# Patient Record
Sex: Female | Born: 1945
Health system: Southern US, Community
[De-identification: ages and names within clinical notes are randomized; demographics above are authoritative.]

## PROBLEM LIST (undated history)

## (undated) DIAGNOSIS — G20A1 Parkinson's disease without dyskinesia, without mention of fluctuations: Secondary | ICD-10-CM

## (undated) DIAGNOSIS — F0390 Unspecified dementia without behavioral disturbance: Secondary | ICD-10-CM

## (undated) DIAGNOSIS — M1990 Unspecified osteoarthritis, unspecified site: Secondary | ICD-10-CM

## (undated) DIAGNOSIS — I10 Essential (primary) hypertension: Secondary | ICD-10-CM

## (undated) DIAGNOSIS — G20 Parkinson's disease: Secondary | ICD-10-CM

## (undated) HISTORY — DX: Parkinson's disease without dyskinesia, without mention of fluctuations: G20.A1

## (undated) HISTORY — PX: ABDOMINAL HYSTERECTOMY: SHX81

## (undated) HISTORY — DX: Parkinson's disease: G20

## (undated) HISTORY — DX: Unspecified osteoarthritis, unspecified site: M19.90

## (undated) HISTORY — PX: REPLACEMENT TOTAL KNEE: SUR1224

## (undated) HISTORY — DX: Essential (primary) hypertension: I10

---

## 2015-01-19 HISTORY — PX: HIP ARTHROPLASTY: SHX981

## 2015-02-12 ENCOUNTER — Encounter: Payer: Self-pay | Admitting: Internal Medicine

## 2015-02-12 ENCOUNTER — Non-Acute Institutional Stay (SKILLED_NURSING_FACILITY): Payer: Medicare Other | Admitting: Internal Medicine

## 2015-02-12 DIAGNOSIS — Z9660 Presence of unspecified orthopedic joint implant: Secondary | ICD-10-CM

## 2015-02-12 DIAGNOSIS — Z96649 Presence of unspecified artificial hip joint: Secondary | ICD-10-CM | POA: Insufficient documentation

## 2015-02-12 DIAGNOSIS — D62 Acute posthemorrhagic anemia: Secondary | ICD-10-CM | POA: Diagnosis not present

## 2015-02-12 DIAGNOSIS — G20 Parkinson's disease: Secondary | ICD-10-CM | POA: Diagnosis not present

## 2015-02-12 DIAGNOSIS — N183 Chronic kidney disease, stage 3 unspecified: Secondary | ICD-10-CM

## 2015-02-12 DIAGNOSIS — M1990 Unspecified osteoarthritis, unspecified site: Secondary | ICD-10-CM | POA: Insufficient documentation

## 2015-02-12 DIAGNOSIS — G20A1 Parkinson's disease without dyskinesia, without mention of fluctuations: Secondary | ICD-10-CM | POA: Insufficient documentation

## 2015-02-12 DIAGNOSIS — I10 Essential (primary) hypertension: Secondary | ICD-10-CM

## 2015-02-12 NOTE — Assessment & Plan Note (Signed)
D/c Hb 9.2, no reported tx needed

## 2015-02-12 NOTE — Progress Notes (Deleted)
MRN: [id number] Name: Rhonda Santana  Sex: female Age: 69 y.o. DOB: May 02, 1946  PSC #:  Facility/Room: Level Of Care: SNF Provider: Merrilee SeashoreALEXANDER, ANNE D Emergency Contacts: No emergency contact information on file.  Code Status:   Allergies: Review of patient's allergies indicates not on file.  No chief complaint on file.   HPI: Patient is 69 y.o. female who  No past medical history on file.  No past surgical history on file.    Medication List    Notice  As of 02/12/2015 11:40 AM   You have not been prescribed any medications.      No orders of the defined types were placed in this encounter.     There is no immunization history on file for this patient.  History  Substance Use Topics  . Smoking status: Not on file  . Smokeless tobacco: Not on file  . Alcohol Use: Not on file    There were no vitals filed for this visit.  Physical Exam  GENERAL APPEARANCE: Alert, conversant. No acute distress.  HEENT: Unremarkable. RESPIRATORY: Breathing is even, unlabored. Lung sounds are clear   CARDIOVASCULAR: Heart RRR no murmurs, rubs or gallops. No peripheral edema.  GASTROINTESTINAL: Abdomen is soft, non-tender, not distended w/ normal bowel sounds.  NEUROLOGIC: Cranial nerves 2-12 grossly intact. Moves all extremities  There are no active problems to display for this patient.   CBC No results found for: WBC, RBC, HGB, HCT, PLT, MCV, LYMPHSABS, MONOABS, EOSABS, BASOSABS  CMP  No results found for: NA, K, CL, CO2, GLUCOSE, BUN, CREATININE, CALCIUM, PROT, ALBUMIN, AST, ALT, ALKPHOS, BILITOT, GFRNONAA, GFRAA  Assessment and Plan  No problem-specific assessment & plan notes found for this encounter.   Margit HanksALEXANDER, ANNE D, MD   Pt seen 02/07/2015

## 2015-02-12 NOTE — Progress Notes (Signed)
MRN: [id number] Name: Rhonda Santana  Sex: female Age: 69 y.o. DOB: 1946/07/29  [facility] #: [facility] Facility/Room:107 Level Of Care: SNF Provider: Merrilee Seashore D Emergency Contacts: No emergency contact information on file.  Code Status: DNR  Allergies: Codeine; Lipitor; Penicillins; and Sulfa antibiotics  Chief Complaint  Patient presents with  . New Admit To SNF    HPI: Patient is 69 y.o. female who is admitted to SNF s/p L hip arthroplasty for OT/PT.  Past Medical History  Diagnosis Date  . Hypertension   . Arthritis   . Parkinson's disease     Past Surgical History  Procedure Laterality Date  . Hip arthroplasty Left 01/2015  . Abdominal hysterectomy        Medication List       This list is accurate as of: 02/12/15 12:23 PM.  Always use your most recent med list.               alendronate 70 MG tablet  Commonly known as:  FOSAMAX  Take 70 mg by mouth once a week. Take with a full glass of water on an empty stomach.     betamethasone dipropionate 0.05 % cream  Commonly known as:  DIPROLENE  Apply topically 2 (two) times daily as needed.     Calcium Carb-Cholecalciferol 500-400 MG-UNIT Chew  Chew by mouth.     Calcium Carb-Cholecalciferol 600-200 MG-UNIT Tabs  Take 2 tablets by mouth daily.     ferrous sulfate 325 (65 FE) MG tablet  Take 325 mg by mouth daily with breakfast.     folic acid 1 MG tablet  Commonly known as:  FOLVITE  Take 1 mg by mouth daily.     gabapentin 600 MG tablet  Commonly known as:  NEURONTIN  Take 1,800 mg by mouth daily.     hydrochlorothiazide 25 MG tablet  Commonly known as:  HYDRODIURIL  Take 25 mg by mouth daily.     HYDROcodone-acetaminophen 10-325 MG per tablet  Commonly known as:  NORCO  Take 1 tablet by mouth every 4 (four) hours as needed.     lisinopril 20 MG tablet  Commonly known as:  PRINIVIL,ZESTRIL  Take 20 mg by mouth daily.     methotrexate 15 MG tablet  Commonly known as:  RHEUMATREX  Take 15 mg  by mouth once a week. Caution: Chemotherapy. Protect from light.     metroNIDAZOLE 0.75 % cream  Commonly known as:  METROCREAM  Apply topically daily.     predniSONE 5 MG tablet  Commonly known as:  DELTASONE  Take 5 mg by mouth daily with breakfast.     rotigotine 4 MG/24HR  Commonly known as:  NEUPRO  Place 1 patch onto the skin daily.     sertraline 100 MG tablet  Commonly known as:  ZOLOFT  Take 100 mg by mouth daily.     traMADol 50 MG tablet  Commonly known as:  ULTRAM  Take 50 mg by mouth 4 (four) times daily.        Meds ordered this encounter  Medications  . alendronate (FOSAMAX) 70 MG tablet    Sig: Take 70 mg by mouth once a week. Take with a full glass of water on an empty stomach.  . betamethasone dipropionate (DIPROLENE) 0.05 % cream    Sig: Apply topically 2 (two) times daily as needed.  . Calcium Carb-Cholecalciferol 500-400 MG-UNIT CHEW    Sig: Chew by mouth.  . Calcium Carb-Cholecalciferol 600-200 MG-UNIT  TABS    Sig: Take 2 tablets by mouth daily.  . ferrous sulfate 325 (65 FE) MG tablet    Sig: Take 325 mg by mouth daily with breakfast.  . folic acid (FOLVITE) 1 MG tablet    Sig: Take 1 mg by mouth daily.  Marland Kitchen gabapentin (NEURONTIN) 600 MG tablet    Sig: Take 1,800 mg by mouth daily.  . hydrochlorothiazide (HYDRODIURIL) 25 MG tablet    Sig: Take 25 mg by mouth daily.  Marland Kitchen lisinopril (PRINIVIL,ZESTRIL) 20 MG tablet    Sig: Take 20 mg by mouth daily.  . methotrexate (RHEUMATREX) 15 MG tablet    Sig: Take 15 mg by mouth once a week. Caution: Chemotherapy. Protect from light.  . metroNIDAZOLE (METROCREAM) 0.75 % cream    Sig: Apply topically daily.  . predniSONE (DELTASONE) 5 MG tablet    Sig: Take 5 mg by mouth daily with breakfast.  . rotigotine (NEUPRO) 4 MG/24HR    Sig: Place 1 patch onto the skin daily.  . sertraline (ZOLOFT) 100 MG tablet    Sig: Take 100 mg by mouth daily.  . traMADol (ULTRAM) 50 MG tablet    Sig: Take 50 mg by mouth 4  (four) times daily.  Marland Kitchen HYDROcodone-acetaminophen (NORCO) 10-325 MG per tablet    Sig: Take 1 tablet by mouth every 4 (four) hours as needed.     There is no immunization history on file for this patient.  History  Substance Use Topics  . Smoking status: Never Smoker   . Smokeless tobacco: Not on file  . Alcohol Use: No    Family history is noncontributory    Review of Systems  DATA OBTAINED: from patient, nurse GENERAL:  no fevers, fatigue, appetite changes SKIN: No itching, rash or wounds EYES: No eye pain, redness, discharge EARS: No earache, tinnitus, change in hearing NOSE: No congestion, drainage or bleeding  MOUTH/THROAT: No mouth or tooth pain, No sore throat RESPIRATORY: No cough, wheezing, SOB CARDIAC: No chest pain, palpitations, lower extremity edema  GI: No abdominal pain, No N/V/D or constipation, No heartburn or reflux  GU: No dysuria, frequency or urgency, or incontinence  MUSCULOSKELETAL: No unrelieved bone/joint pain NEUROLOGIC: No headache, dizziness  PSYCHIATRIC: No overt anxiety or sadness, No behavior issue.   Filed Vitals:   02/12/15 1142  BP: 107/62  Pulse: 73  Temp: 98.9 F (37.2 C)  Resp: 18    Physical Exam  GENERAL APPEARANCE: Alert, conversant,  No acute distress.  SKIN: No diaphoresis rash HEAD: Normocephalic, atraumatic  EYES: Conjunctiva/lids clear. Pupils round, reactive. EOMs intact.  EARS: External exam WNL, canals clear. Hearing grossly normal.  NOSE: No deformity or discharge.  MOUTH/THROAT: Lips w/o lesions  RESPIRATORY: Breathing is even, unlabored. Lung sounds are clear   CARDIOVASCULAR: Heart RRR no murmurs, rubs or gallops. No peripheral edema.   GASTROINTESTINAL: Abdomen is soft, non-tender, not distended w/ normal bowel sounds. GENITOURINARY: Bladder non tender, not distended  MUSCULOSKELETAL: No abnormal joints or musculature NEUROLOGIC:  Cranial nerves 2-12 grossly intac PSYCHIATRIC: Mood and affect appropriate to  situation, no behavioral issues  Patient Active Problem List   Diagnosis Date Noted  . S/P total hip arthroplasty 02/12/2015  . Acute blood loss anemia 02/12/2015  . CKD (chronic kidney disease) stage 3, GFR 30-59 ml/min 02/12/2015  . Hypertension   . Arthritis   . Parkinson's disease         Assessment and Plan  S/P total hip arthroplasty For end stage  OA; norco for pain, ASA for DVT prophylaxis , SNF for OT/PT.   Acute blood loss anemia D/c Hb 9.2, no reported tx needed   CKD (chronic kidney disease) stage 3, GFR 30-59 ml/min GFR 757, BUN/Cr -17/0.98; plan monitor fluid intake   Hypertension Reported uncontrolled just after surg but controlled now on Lisinopril 20 mg and HCTZ 25 mg   Parkinson's disease Contn ue home med, neurontin and neupro    Pt seen 02/07/2015 Rhonda Santana, Talajah Slimp D, MD

## 2015-02-12 NOTE — Assessment & Plan Note (Signed)
For end stage OA; norco for pain, ASA for DVT prophylaxis , SNF for OT/PT.

## 2015-02-12 NOTE — Assessment & Plan Note (Addendum)
GFR 57, BUN/Cr -17/0.98; plan monitor fluid intake

## 2015-02-12 NOTE — Assessment & Plan Note (Signed)
Contn ue home med, neurontin and neupro

## 2015-02-12 NOTE — Assessment & Plan Note (Signed)
Reported uncontrolled just after surg but controlled now on Lisinopril 20 mg and HCTZ 25 mg

## 2015-02-14 ENCOUNTER — Encounter: Payer: Self-pay | Admitting: Internal Medicine

## 2015-02-14 ENCOUNTER — Non-Acute Institutional Stay (SKILLED_NURSING_FACILITY): Payer: Medicare Other | Admitting: Internal Medicine

## 2015-02-14 DIAGNOSIS — D62 Acute posthemorrhagic anemia: Secondary | ICD-10-CM | POA: Diagnosis not present

## 2015-02-14 DIAGNOSIS — I10 Essential (primary) hypertension: Secondary | ICD-10-CM

## 2015-02-14 DIAGNOSIS — G20 Parkinson's disease: Secondary | ICD-10-CM

## 2015-02-14 DIAGNOSIS — Z9660 Presence of unspecified orthopedic joint implant: Secondary | ICD-10-CM

## 2015-02-14 DIAGNOSIS — N183 Chronic kidney disease, stage 3 unspecified: Secondary | ICD-10-CM

## 2015-02-14 DIAGNOSIS — Z96649 Presence of unspecified artificial hip joint: Secondary | ICD-10-CM

## 2015-02-14 NOTE — Progress Notes (Signed)
MRN: [id number] Name: Rhonda Santana  Sex: female Age: 69 y.o. DOB: 1946/06/21  [facility] #: [facility] Facility/Room:106 Level Of Care: SNF Provider: Merrilee Seashore D Emergency Contacts: No emergency contact information on file.  Code Status: DNR  Allergies: Codeine; Lipitor; Penicillins; and Sulfa antibiotics  Chief Complaint  Patient presents with  . Discharge Note    HPI: Patient is 69 y.o. female who was admitted to SNF s/p hip arthroplasty who is now ready to be d/c to home.  Past Medical History  Diagnosis Date  . Hypertension   . Arthritis   . Parkinson's disease     Past Surgical History  Procedure Laterality Date  . Hip arthroplasty Left 01/2015  . Abdominal hysterectomy        Medication List       This list is accurate as of: 02/14/15  2:16 PM.  Always use your most recent med list.               alendronate 70 MG tablet  Commonly known as:  FOSAMAX  Take 70 mg by mouth once a week. Take with a full glass of water on an empty stomach.     betamethasone dipropionate 0.05 % cream  Commonly known as:  DIPROLENE  Apply topically 2 (two) times daily as needed.     Calcium Carb-Cholecalciferol 600-200 MG-UNIT Tabs  Take 2 tablets by mouth daily.     ferrous sulfate 325 (65 FE) MG tablet  Take 325 mg by mouth daily with breakfast.     folic acid 1 MG tablet  Commonly known as:  FOLVITE  Take 1 mg by mouth daily.     gabapentin 600 MG tablet  Commonly known as:  NEURONTIN  Take 600 mg by mouth 2 (two) times daily. 1 po q am and 2 po qhs     hydrochlorothiazide 25 MG tablet  Commonly known as:  HYDRODIURIL  Take 25 mg by mouth daily.     lisinopril 20 MG tablet  Commonly known as:  PRINIVIL,ZESTRIL  Take 20 mg by mouth daily.     methotrexate 15 MG tablet  Commonly known as:  RHEUMATREX  Take 15 mg by mouth once a week. Caution: Chemotherapy. Protect from light.     metroNIDAZOLE 0.75 % cream  Commonly known as:  METROCREAM  Apply topically  daily.     predniSONE 5 MG tablet  Commonly known as:  DELTASONE  Take 5 mg by mouth daily with breakfast.     rotigotine 4 MG/24HR  Commonly known as:  NEUPRO  Place 1 patch onto the skin daily.     sertraline 100 MG tablet  Commonly known as:  ZOLOFT  Take 100 mg by mouth daily.     traMADol 50 MG tablet  Commonly known as:  ULTRAM  Take 50 mg by mouth 4 (four) times daily. #20, NR        No orders of the defined types were placed in this encounter.     There is no immunization history on file for this patient.  History  Substance Use Topics  . Smoking status: Never Smoker   . Smokeless tobacco: Not on file  . Alcohol Use: No    Filed Vitals:   02/14/15 1350  BP: 105/58  Pulse: 74  Temp: 97.6 F (36.4 C)  Resp: 18    Physical Exam  GENERAL APPEARANCE: Alert, conversant. No acute distress.  HEENT: Unremarkable. RESPIRATORY: Breathing is even, unlabored. Lung sounds are  clear   CARDIOVASCULAR: Heart RRR no murmurs, rubs or gallops. No peripheral edema.  GASTROINTESTINAL: Abdomen is soft, non-tender, not distended w/ normal bowel sounds.  NEUROLOGIC: Cranial nerves 2-12 grossly intact. Moves all extremities  Patient Active Problem List   Diagnosis Date Noted  . S/P total hip arthroplasty 02/12/2015  . Acute blood loss anemia 02/12/2015  . CKD (chronic kidney disease) stage 3, GFR 30-59 ml/min 02/12/2015  . Hypertension   . Arthritis   . Parkinson's disease          Assessment and Plan  Pt had an uneventful rehab course and is d/c to home in stable condition with HH/OT/PT.  Margit HanksALEXANDER, Mcihael Hinderman D, MD

## 2017-07-26 ENCOUNTER — Emergency Department (HOSPITAL_BASED_OUTPATIENT_CLINIC_OR_DEPARTMENT_OTHER)
Admission: EM | Admit: 2017-07-26 | Discharge: 2017-07-26 | Disposition: A | Discharge status: Home / Self Care | Payer: Medicare Other | Attending: Emergency Medicine | Admitting: Emergency Medicine

## 2017-07-26 ENCOUNTER — Emergency Department (HOSPITAL_BASED_OUTPATIENT_CLINIC_OR_DEPARTMENT_OTHER): Payer: Medicare Other

## 2017-07-26 ENCOUNTER — Encounter (HOSPITAL_BASED_OUTPATIENT_CLINIC_OR_DEPARTMENT_OTHER): Payer: Self-pay

## 2017-07-26 DIAGNOSIS — N183 Chronic kidney disease, stage 3 (moderate): Secondary | ICD-10-CM | POA: Insufficient documentation

## 2017-07-26 DIAGNOSIS — Y92019 Unspecified place in single-family (private) house as the place of occurrence of the external cause: Secondary | ICD-10-CM | POA: Insufficient documentation

## 2017-07-26 DIAGNOSIS — Z79899 Other long term (current) drug therapy: Secondary | ICD-10-CM | POA: Insufficient documentation

## 2017-07-26 DIAGNOSIS — S0101XA Laceration without foreign body of scalp, initial encounter: Secondary | ICD-10-CM | POA: Diagnosis not present

## 2017-07-26 DIAGNOSIS — Z7982 Long term (current) use of aspirin: Secondary | ICD-10-CM | POA: Diagnosis not present

## 2017-07-26 DIAGNOSIS — Z96651 Presence of right artificial knee joint: Secondary | ICD-10-CM | POA: Diagnosis not present

## 2017-07-26 DIAGNOSIS — S0990XA Unspecified injury of head, initial encounter: Secondary | ICD-10-CM | POA: Diagnosis present

## 2017-07-26 DIAGNOSIS — Y998 Other external cause status: Secondary | ICD-10-CM | POA: Insufficient documentation

## 2017-07-26 DIAGNOSIS — R51 Headache: Secondary | ICD-10-CM | POA: Diagnosis not present

## 2017-07-26 DIAGNOSIS — Z96642 Presence of left artificial hip joint: Secondary | ICD-10-CM | POA: Diagnosis not present

## 2017-07-26 DIAGNOSIS — G20 Parkinson's disease: Secondary | ICD-10-CM | POA: Insufficient documentation

## 2017-07-26 DIAGNOSIS — M546 Pain in thoracic spine: Secondary | ICD-10-CM | POA: Insufficient documentation

## 2017-07-26 DIAGNOSIS — I129 Hypertensive chronic kidney disease with stage 1 through stage 4 chronic kidney disease, or unspecified chronic kidney disease: Secondary | ICD-10-CM | POA: Insufficient documentation

## 2017-07-26 DIAGNOSIS — Y939 Activity, unspecified: Secondary | ICD-10-CM | POA: Diagnosis not present

## 2017-07-26 DIAGNOSIS — M542 Cervicalgia: Secondary | ICD-10-CM | POA: Diagnosis not present

## 2017-07-26 DIAGNOSIS — S0003XA Contusion of scalp, initial encounter: Secondary | ICD-10-CM

## 2017-07-26 DIAGNOSIS — Z96652 Presence of left artificial knee joint: Secondary | ICD-10-CM | POA: Diagnosis not present

## 2017-07-26 DIAGNOSIS — W19XXXA Unspecified fall, initial encounter: Secondary | ICD-10-CM | POA: Diagnosis not present

## 2017-07-26 MED ORDER — BACITRACIN-NEOMYCIN-POLYMYXIN 400-5-5000 EX OINT: 1.0000 "application " | TOPICAL_OINTMENT | Freq: Two times a day (BID) | CUTANEOUS | 0 refills | Status: AC

## 2017-07-26 NOTE — ED Notes (Signed)
ED Provider at bedside. 

## 2017-07-26 NOTE — ED Triage Notes (Signed)
Pt presents after a fall tonight. Pt has lac to back of her head, bleeding controlled. Pt c/o pain to her upper back. Pt denies LOC. Pt reports hx of parkinson's with falls often

## 2017-07-26 NOTE — ED Provider Notes (Signed)
[facility] EMERGENCY DEPARTMENT Provider Note   CSN: [id number] Arrival date & time: 07/26/17  0118     History   Chief Complaint Chief Complaint  Patient presents with  . Fall    HPI Rhonda Santana is a 71 y.o. female.  HPI Patient comes in with chief complaint of fall. Patient has history of Parkinson's disease and high blood pressure.  Patient is not on any anticoagulants.  Patient reports that she had a fall which was mechanical in nature earlier at her home.  Patient started having bleeding from her scalp and decided to come to the ER.  Patient is having some neck pain and mid thoracic pain as well.  Patient denies any numbness, tingling, seizures, loss of consciousness.  Patient does have a headache as well.  Past Medical History:  Diagnosis Date  . Arthritis   . Hypertension   . Parkinson's disease [facility])     Patient Active Problem List   Diagnosis Date Noted  . S/P total hip arthroplasty 02/12/2015  . Acute blood loss anemia 02/12/2015  . CKD (chronic kidney disease) stage 3, GFR 30-59 ml/min (HCC) 02/12/2015  . Hypertension   . Arthritis   . Parkinson's disease [facility])     Past Surgical History:  Procedure Laterality Date  . ABDOMINAL HYSTERECTOMY    . HIP ARTHROPLASTY Left 01/2015  . REPLACEMENT TOTAL KNEE Bilateral     OB History    No data available       Home Medications    Prior to Admission medications   Medication Sig Start Date End Date Taking? Authorizing Provider  aspirin 81 MG tablet Take 81 mg by mouth daily.   Yes [provider]  Calcium Carb-Cholecalciferol 600-200 MG-UNIT TABS Take 2 tablets by mouth daily.   Yes [provider]  ferrous sulfate 325 (65 FE) MG tablet Take 325 mg by mouth daily with breakfast.   Yes [provider]  gabapentin (NEURONTIN) 600 MG tablet Take 600 mg by mouth 2 (two) times daily. 1 po q am and 2 po qhs   Yes [provider]  methotrexate (RHEUMATREX) 15 MG tablet  Take 15 mg by mouth once a week. Caution: Chemotherapy. Protect from light.   Yes [provider]  metroNIDAZOLE (METROCREAM) 0.75 % cream Apply topically daily.   Yes [provider]  predniSONE (DELTASONE) 5 MG tablet Take 5 mg by mouth daily with breakfast.   Yes [provider]  rotigotine (NEUPRO) 4 MG/24HR Place 1 patch onto the skin daily.   Yes [provider]  alendronate (FOSAMAX) 70 MG tablet Take 70 mg by mouth once a week. Take with a full glass of water on an empty stomach.    [provider]  betamethasone dipropionate (DIPROLENE) 0.05 % cream Apply topically 2 (two) times daily as needed.    [provider]  folic acid (FOLVITE) 1 MG tablet Take 1 mg by mouth daily.    [provider]  hydrochlorothiazide (HYDRODIURIL) 25 MG tablet Take 25 mg by mouth daily.    [provider]  lisinopril (PRINIVIL,ZESTRIL) 20 MG tablet Take 20 mg by mouth daily.    [provider]  neomycin-bacitracin-polymyxin (NEOSPORIN) ointment Apply 1 application every 12 (twelve) hours topically. apply to eye 07/26/17   Derwood Kaplan, MD  sertraline (ZOLOFT) 100 MG tablet Take 100 mg by mouth daily.    [provider]  traMADol (ULTRAM) 50 MG tablet Take 50 mg by mouth 4 (  four) times daily. #20, NR    [provider]    Family History Family History  Problem Relation Age of Onset  . Thrombosis Mother   . Cancer Father        breast  . Heart disease Neg Hx   . Diabetes Neg Hx     Social History Social History   Tobacco Use  . Smoking status: Never Smoker  . Smokeless tobacco: Never Used  Substance Use Topics  . Alcohol use: No  . Drug use: No     Allergies   Codeine; Lipitor [atorvastatin]; Penicillins; and Sulfa antibiotics   Review of Systems Review of Systems  Constitutional: Negative for activity change.  Musculoskeletal: Positive for back pain.  Skin: Positive for wound.    Neurological: Positive for headaches.  Hematological: Does not bruise/bleed easily.     Physical Exam Updated Vital Signs BP 138/86 (BP Location: Left Arm)   Pulse 87   Temp 97.7 F (36.5 C) (Oral)   Resp 18   Ht 5\' 1"  (1.549 m)   Wt 72.6 kg (160 lb)   SpO2 97%   BMI 30.23 kg/m   Physical Exam  Constitutional: She is oriented to person, place, and time. She appears well-developed.  HENT:  Head: Normocephalic and atraumatic.  Eyes: EOM are normal.  Neck: Normal range of motion. Neck supple.  Cardiovascular: Normal rate.  Pulmonary/Chest: Effort normal.  Abdominal: Bowel sounds are normal.  Musculoskeletal:  Patient has midline C-spine and T-spine tenderness. Otherwise:  Head to toe evaluation shows no hematoma, bleeding of the scalp, no facial abrasions, no spine step offs, crepitus of the chest or neck, no tenderness to palpation of the bilateral upper and lower extremities, no gross deformities, no chest tenderness, no pelvic pain.   Neurological: She is alert and oriented to person, place, and time.  Skin: Skin is warm and dry.  2 centimeters laceration to the scalp, actively bleeding  Nursing note and vitals reviewed.    ED Treatments / Results  Labs (all labs ordered are listed, but only abnormal results are displayed) Labs Reviewed - No data to display  EKG  EKG Interpretation None       Radiology Dg Thoracic Spine 2 View  Result Date: 07/26/2017 CLINICAL DATA:  Fall with pain EXAM: THORACIC SPINE 2 VIEWS COMPARISON:  None. FINDINGS: Thoracic alignment within normal limits. Vertebral body heights appear grossly maintained. There are mild degenerative changes. IMPRESSION: No acute osseous abnormality Electronically Signed   By: Jasmine PangKim  Fujinaga M.D.   On: 07/26/2017 02:41   Ct Head Wo Contrast  Result Date: 07/26/2017 CLINICAL DATA:  Slip and fall injury. Head trauma. Left posterior scalp laceration. History of Parkinson disease with multiple falls. EXAM:  CT HEAD WITHOUT CONTRAST CT CERVICAL SPINE WITHOUT CONTRAST TECHNIQUE: Multidetector CT imaging of the head and cervical spine was performed following the standard protocol without intravenous contrast. Multiplanar CT image reconstructions of the cervical spine were also generated. COMPARISON:  None. FINDINGS: CT HEAD FINDINGS Brain: Mild diffuse cerebral atrophy. Mild ventricular dilatation consistent with central atrophy. Patchy low-attenuation changes in the deep white matter consistent with small vessel ischemia. No mass-effect or midline shift. No abnormal extra-axial fluid collections. Gray-white matter junctions are distinct. Basal cisterns are not effaced. No acute intracranial hemorrhage. Vascular: No hyperdense vessel or unexpected calcification. Skull: Normal. Negative for fracture or focal lesion. Sinuses/Orbits: No acute finding. Other: None. CT CERVICAL SPINE FINDINGS Alignment: Reversal of the usual cervical lordosis. This may be  due to degenerative change or patient positioning but ligamentous injury or muscle spasm are not excluded. No anterior subluxation. Normal alignment of the facet joints. C1-2 articulation demonstrates normal alignment. Skull base and vertebrae: No acute fracture. No primary bone lesion or focal pathologic process. Soft tissues and spinal canal: No prevertebral fluid or swelling. No visible canal hematoma. Disc levels: Diffuse degenerative change throughout the cervical spine with narrowed interspaces and endplate hypertrophic changes. Changes are most prominent at C4-5, C5-6, and C6-7 levels. Severe degenerative changes at C1 to. Degenerative changes throughout the cervical facet joints. Upper chest: Suggestion of IA nodule in the right lung apex, incompletely visualized. Measurement is about 2 mm. Diffuse thyroid gland enlargement, likely goiter. Other: None. IMPRESSION: 1. No acute intracranial abnormalities. Mild chronic atrophy and small vessel ischemic changes. 2.  Nonspecific reversal of the usual cervical lordosis. Severe degenerative changes in the cervical spine. No acute displaced fractures identified. Electronically Signed   By: Burman Nieves M.D.   On: 07/26/2017 03:09   Ct Cervical Spine Wo Contrast  Result Date: 07/26/2017 CLINICAL DATA:  Slip and fall injury. Head trauma. Left posterior scalp laceration. History of Parkinson disease with multiple falls. EXAM: CT HEAD WITHOUT CONTRAST CT CERVICAL SPINE WITHOUT CONTRAST TECHNIQUE: Multidetector CT imaging of the head and cervical spine was performed following the standard protocol without intravenous contrast. Multiplanar CT image reconstructions of the cervical spine were also generated. COMPARISON:  None. FINDINGS: CT HEAD FINDINGS Brain: Mild diffuse cerebral atrophy. Mild ventricular dilatation consistent with central atrophy. Patchy low-attenuation changes in the deep white matter consistent with small vessel ischemia. No mass-effect or midline shift. No abnormal extra-axial fluid collections. Gray-white matter junctions are distinct. Basal cisterns are not effaced. No acute intracranial hemorrhage. Vascular: No hyperdense vessel or unexpected calcification. Skull: Normal. Negative for fracture or focal lesion. Sinuses/Orbits: No acute finding. Other: None. CT CERVICAL SPINE FINDINGS Alignment: Reversal of the usual cervical lordosis. This may be due to degenerative change or patient positioning but ligamentous injury or muscle spasm are not excluded. No anterior subluxation. Normal alignment of the facet joints. C1-2 articulation demonstrates normal alignment. Skull base and vertebrae: No acute fracture. No primary bone lesion or focal pathologic process. Soft tissues and spinal canal: No prevertebral fluid or swelling. No visible canal hematoma. Disc levels: Diffuse degenerative change throughout the cervical spine with narrowed interspaces and endplate hypertrophic changes. Changes are most prominent at  C4-5, C5-6, and C6-7 levels. Severe degenerative changes at C1 to. Degenerative changes throughout the cervical facet joints. Upper chest: Suggestion of IA nodule in the right lung apex, incompletely visualized. Measurement is about 2 mm. Diffuse thyroid gland enlargement, likely goiter. Other: None. IMPRESSION: 1. No acute intracranial abnormalities. Mild chronic atrophy and small vessel ischemic changes. 2. Nonspecific reversal of the usual cervical lordosis. Severe degenerative changes in the cervical spine. No acute displaced fractures identified. Electronically Signed   By: Burman Nieves M.D.   On: 07/26/2017 03:09    Procedures .Marland KitchenLaceration Repair Date/Time: 07/26/2017 3:43 AM Performed by: Derwood Kaplan, MD Authorized by: Derwood Kaplan, MD   Consent:    Consent obtained:  Verbal   Consent given by:  Patient   Risks discussed:  Pain, poor cosmetic result and infection   Alternatives discussed:  No treatment Anesthesia (see MAR for exact dosages):    Anesthesia method:  None Laceration details:    Location:  Scalp   Scalp location:  Crown   Length (cm):  2   Depth (mm):  2 Repair type:    Repair type:  Simple Pre-procedure details:    Preparation:  Patient was prepped and draped in usual sterile fashion and imaging obtained to evaluate for foreign bodies Exploration:    Hemostasis achieved with:  Direct pressure   Wound extent: no fascia violation noted, no foreign bodies/material noted, no nerve damage noted, no underlying fracture noted and no vascular damage noted     Contaminated: no   Treatment:    Area cleansed with:  Betadine   Amount of cleaning:  Standard   Irrigation solution:  Sterile saline   Visualized foreign bodies/material removed: no   Skin repair:    Repair method:  Staples   Number of staples:  2 Approximation:    Approximation:  Close   Vermilion border: well-aligned   Post-procedure details:    Dressing:  Open (no dressing)   Patient tolerance  of procedure:  Tolerated well, no immediate complications   (including critical care time)  Medications Ordered in ED Medications - No data to display   Initial Impression / Assessment and Plan / ED Course  I have reviewed the triage vital signs and the nursing notes.  Pertinent labs & imaging results that were available during my care of the patient were reviewed by me and considered in my medical decision making (see chart for details).     DDx includes: - Mechanical falls - ICH - Fractures - Contusions - Soft tissue injury  Patient comes in with chief complaint of fall.  Fall appears to be mechanical in nature.  Patient has a scalp laceration which was repaired via staples.  CT scan of the head, C-spine were obtained as we could not clinically clear the patient and they are negative.  Patient also has midline thoracic spine tenderness and the x-rays of the T-spine are negative. Will d/c. Wound care discussion completed.  Final Clinical Impressions(s) / ED Diagnoses   Final diagnoses:  Contusion of scalp, initial encounter  Acute midline thoracic back pain  Laceration of scalp, initial encounter    ED Discharge Orders        Ordered    neomycin-bacitracin-polymyxin (NEOSPORIN) ointment  Every 12 hours     07/26/17 0340       Derwood KaplanNanavati, Dyanara Cozza, MD 07/26/17 0345

## 2017-07-26 NOTE — Discharge Instructions (Signed)
We saw you in the ER after you had a fall. All the imaging results are normal, no fractures seen. No evidence of brain bleed.  Keep the laceration area clean and dry.  See your primary care doctor in 7-10 days for staple removal.  Please be very careful with walking, and do everything possible to prevent falls.

## 2017-07-26 NOTE — ED Notes (Signed)
Patient transported to X-ray & CT °

## 2018-11-03 IMAGING — CT CT CERVICAL SPINE W/O CM
4 of 7 series · 12 of 33 positions shown, 13 images · non-contrast
Comparison: None.

CLINICAL DATA: Slip and fall injury. Head trauma. Left posterior
scalp laceration. History of Parkinson disease with multiple falls.

EXAM:
CT HEAD WITHOUT CONTRAST
CT CERVICAL SPINE WITHOUT CONTRAST
TECHNIQUE: Multidetector CT imaging of the head and cervical spine was
performed following the standard protocol without intravenous
contrast. Multiplanar CT image reconstructions of the cervical spine
were also generated.

[Series 7: c_spine 2.0 i30s 3 · axial · 0.27mm/px · z∈[-262,-190]mm · 3 of 72 slices shown]
[im 18/72  bone]
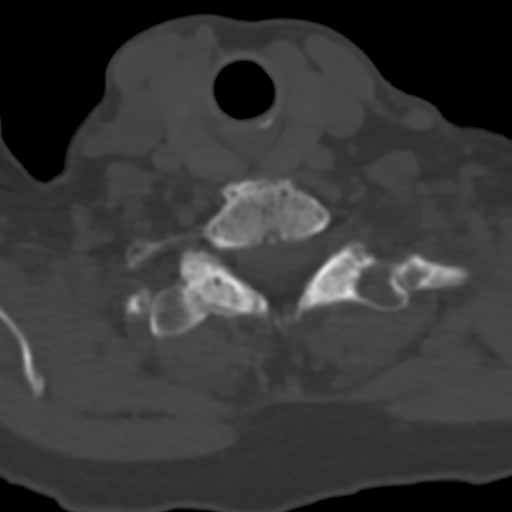
[im 36/72  bone]
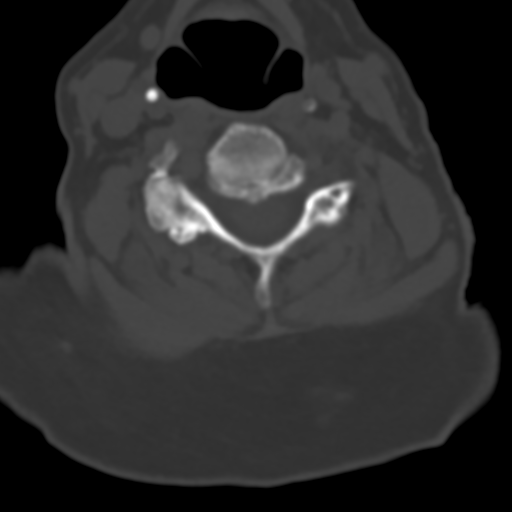
[im 54/72  bone]
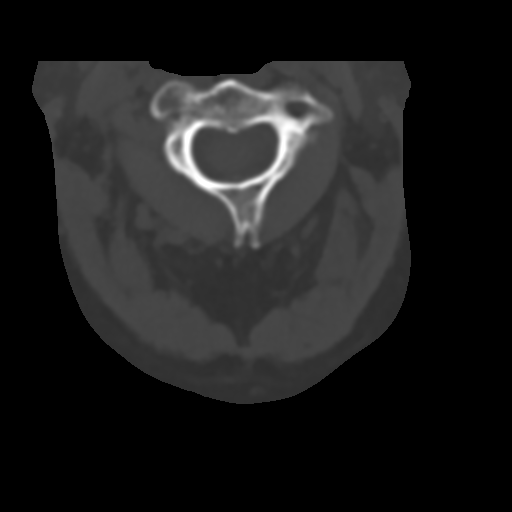

[Series 9: coronal c-sp · coronal · 0.24mm/px · 1 of 61 slices shown]
[im 31/61  bone]
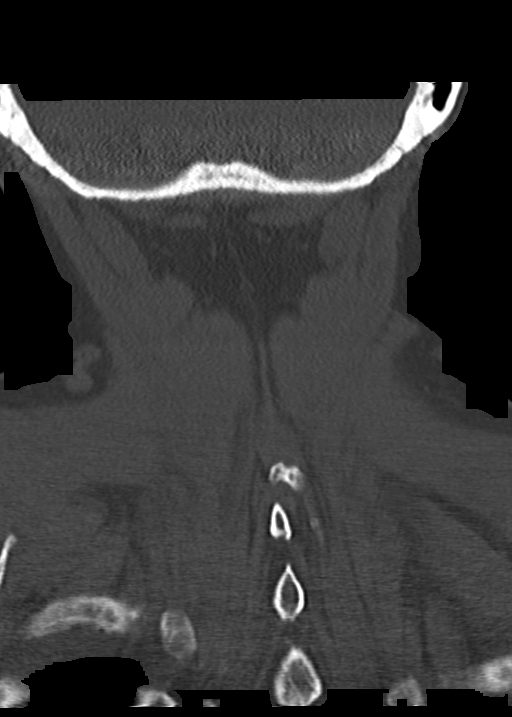

[Series 10: sagittals · sagittal · 0.26mm/px · 4 of 41 slices shown]
[im 9/41  bone]
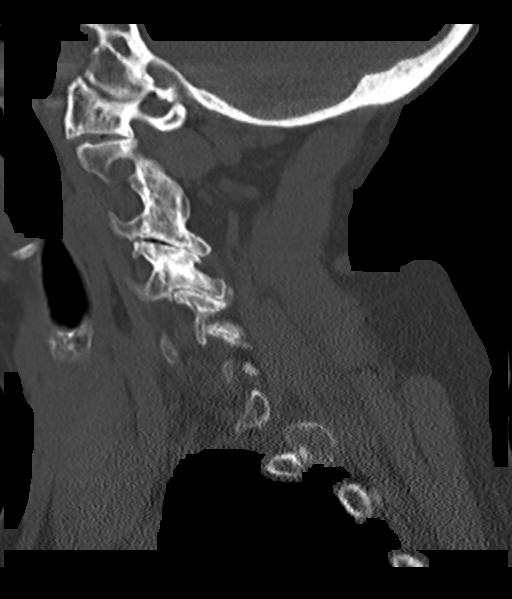
[im 17/41  bone]
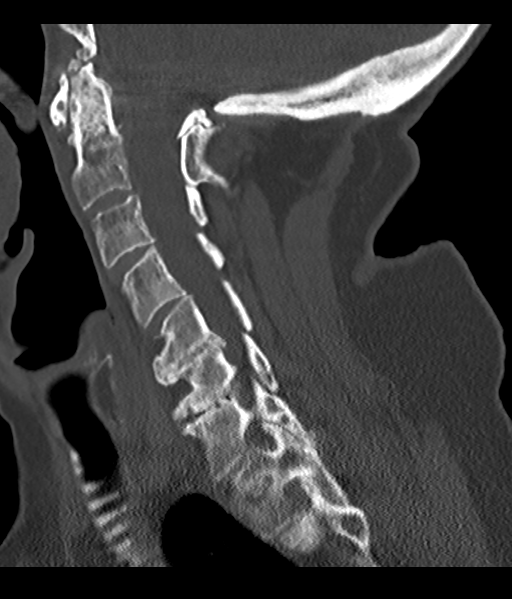
[im 25/41  bone]
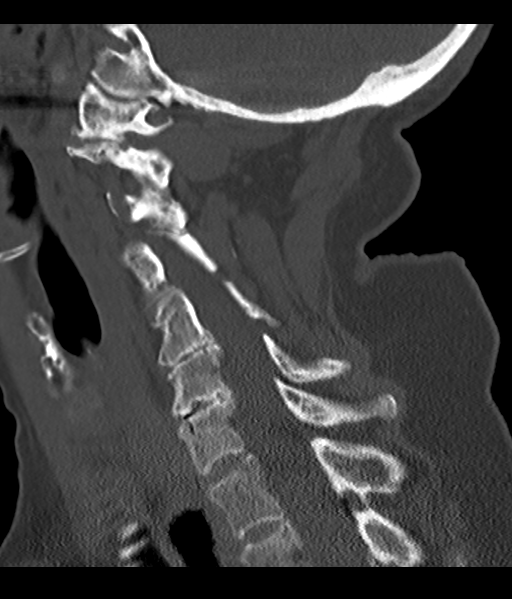
[im 33/41  bone]
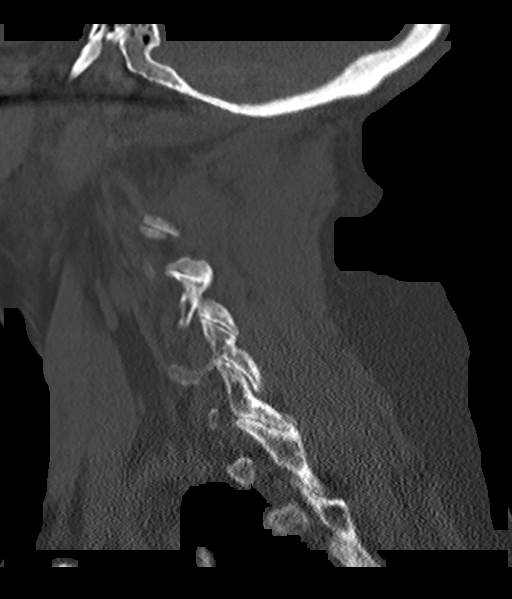

[Series 11: orthogonal c-sp · axial · 0.25mm/px · z∈[-291,-192]mm · 4 of 86 slices shown, 5 images]
[im 18/86  soft-tissue]
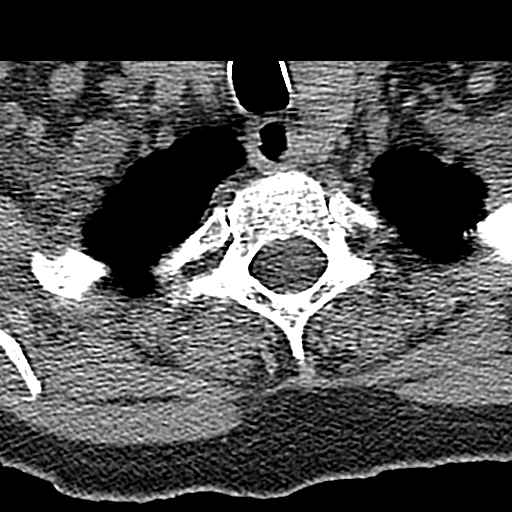
[im 18/86  bone]
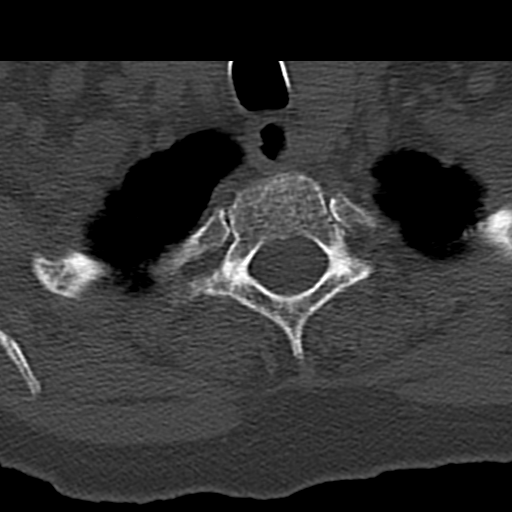
[im 35/86  bone]
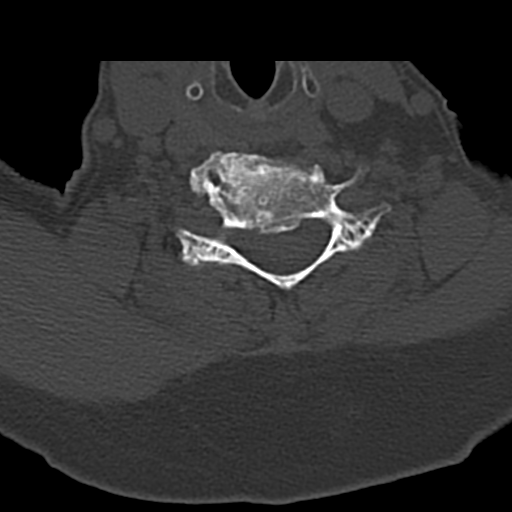
[im 52/86  bone]
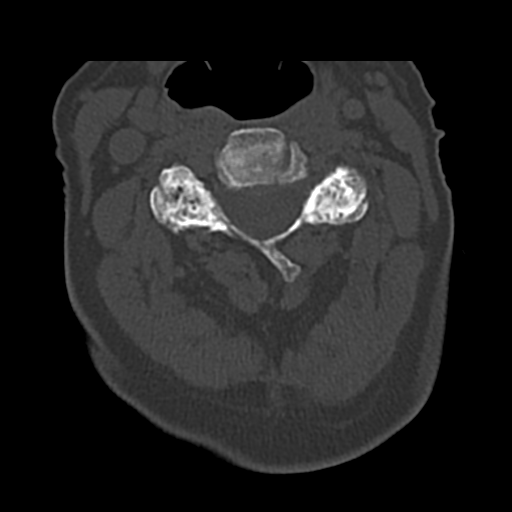
[im 69/86  bone]
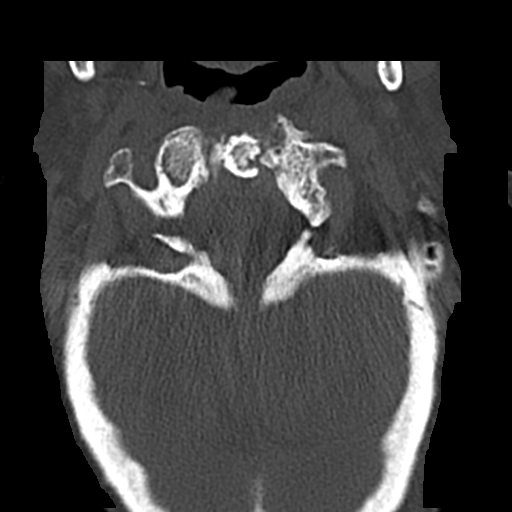

[12 of 33 positions shown; findings below may reference images not displayed]

FINDINGS: CT HEAD FINDINGS

Brain: Mild diffuse cerebral atrophy. Mild ventricular dilatation
consistent with central atrophy. Patchy low-attenuation changes in
the deep white matter consistent with small vessel ischemia. No
mass-effect or midline shift. No abnormal extra-axial fluid
collections. Gray-white matter junctions are distinct. Basal
cisterns are not effaced. No acute intracranial hemorrhage.

Vascular: No hyperdense vessel or unexpected calcification.

Skull: Normal. Negative for fracture or focal lesion.

Sinuses/Orbits: No acute finding.

Other: None.

CT CERVICAL SPINE FINDINGS

Alignment: Reversal of the usual cervical lordosis. This may be due
to degenerative change or patient positioning but ligamentous injury
or muscle spasm are not excluded. No anterior subluxation. Normal
alignment of the facet joints. C1-2 articulation demonstrates normal
alignment.

Skull base and vertebrae: No acute fracture. No primary bone lesion
or focal pathologic process.

Soft tissues and spinal canal: No prevertebral fluid or swelling. No
visible canal hematoma.

Disc levels: Diffuse degenerative change throughout the cervical
spine with narrowed interspaces and endplate hypertrophic changes.
Changes are most prominent at C4-5, C5-6, and C6-7 levels. Severe
degenerative changes at C1 to. Degenerative changes throughout the
cervical facet joints.

Upper chest: Suggestion of IA nodule in the right lung apex,
incompletely visualized. Measurement is about 2 mm. Diffuse thyroid
gland enlargement, likely goiter.

Other: None.
IMPRESSION: 1. No acute intracranial abnormalities. Mild chronic atrophy and
small vessel ischemic changes.
2. Nonspecific reversal of the usual cervical lordosis. Severe
degenerative changes in the cervical spine. No acute displaced
fractures identified.

## 2018-12-16 ENCOUNTER — Encounter (HOSPITAL_BASED_OUTPATIENT_CLINIC_OR_DEPARTMENT_OTHER): Payer: Self-pay | Admitting: Emergency Medicine

## 2018-12-16 ENCOUNTER — Emergency Department (HOSPITAL_BASED_OUTPATIENT_CLINIC_OR_DEPARTMENT_OTHER)
Admission: EM | Admit: 2018-12-16 | Discharge: 2018-12-16 | Disposition: A | Discharge status: Home / Self Care | Payer: Medicare Other | Attending: Emergency Medicine | Admitting: Emergency Medicine

## 2018-12-16 ENCOUNTER — Other Ambulatory Visit: Payer: Self-pay

## 2018-12-16 ENCOUNTER — Emergency Department (HOSPITAL_BASED_OUTPATIENT_CLINIC_OR_DEPARTMENT_OTHER): Payer: Medicare Other

## 2018-12-16 DIAGNOSIS — S0990XA Unspecified injury of head, initial encounter: Secondary | ICD-10-CM | POA: Diagnosis present

## 2018-12-16 DIAGNOSIS — G20 Parkinson's disease: Secondary | ICD-10-CM | POA: Diagnosis not present

## 2018-12-16 DIAGNOSIS — Z7982 Long term (current) use of aspirin: Secondary | ICD-10-CM | POA: Insufficient documentation

## 2018-12-16 DIAGNOSIS — I129 Hypertensive chronic kidney disease with stage 1 through stage 4 chronic kidney disease, or unspecified chronic kidney disease: Secondary | ICD-10-CM | POA: Diagnosis not present

## 2018-12-16 DIAGNOSIS — W1839XA Other fall on same level, initial encounter: Secondary | ICD-10-CM | POA: Diagnosis not present

## 2018-12-16 DIAGNOSIS — Y929 Unspecified place or not applicable: Secondary | ICD-10-CM | POA: Insufficient documentation

## 2018-12-16 DIAGNOSIS — Z79899 Other long term (current) drug therapy: Secondary | ICD-10-CM | POA: Insufficient documentation

## 2018-12-16 DIAGNOSIS — S60811A Abrasion of right wrist, initial encounter: Secondary | ICD-10-CM | POA: Insufficient documentation

## 2018-12-16 DIAGNOSIS — Z96642 Presence of left artificial hip joint: Secondary | ICD-10-CM | POA: Diagnosis not present

## 2018-12-16 DIAGNOSIS — S60511A Abrasion of right hand, initial encounter: Secondary | ICD-10-CM | POA: Diagnosis not present

## 2018-12-16 DIAGNOSIS — Y9389 Activity, other specified: Secondary | ICD-10-CM | POA: Insufficient documentation

## 2018-12-16 DIAGNOSIS — Y999 Unspecified external cause status: Secondary | ICD-10-CM | POA: Diagnosis not present

## 2018-12-16 DIAGNOSIS — N183 Chronic kidney disease, stage 3 (moderate): Secondary | ICD-10-CM | POA: Diagnosis not present

## 2018-12-16 DIAGNOSIS — Z96653 Presence of artificial knee joint, bilateral: Secondary | ICD-10-CM | POA: Diagnosis not present

## 2018-12-16 DIAGNOSIS — T148XXA Other injury of unspecified body region, initial encounter: Secondary | ICD-10-CM

## 2018-12-16 DIAGNOSIS — S0083XA Contusion of other part of head, initial encounter: Secondary | ICD-10-CM | POA: Insufficient documentation

## 2018-12-16 DIAGNOSIS — W19XXXA Unspecified fall, initial encounter: Secondary | ICD-10-CM

## 2018-12-16 MED ORDER — ONDANSETRON 4 MG PO TBDP
4.0000 mg | ORAL_TABLET | Freq: Once | ORAL | Status: AC
  Administered 2018-12-16: 4 mg via ORAL
  Filled 2018-12-16: qty 1

## 2018-12-16 NOTE — ED Provider Notes (Signed)
[facility] EMERGENCY DEPARTMENT Provider Note   CSN: [id number] Arrival date & time: 12/16/18  1311    History   Chief Complaint Chief Complaint  Patient presents with   Fall   Headache    HPI Rhonda Santana is a 73 y.o. female.     Patient is a 73 year old female who presents after a fall.  She has Parkinson's and arthritis and she was leaning over to help an earth warm back to the grass and in doing so fell forward striking her head on the pavement.  She also has some abrasions to her hand and elbow.  She is not on anticoagulants.  She has a moderate headache and nausea.  She says she has an dizziness when she opens her eyes and looks around although she denies any spinning sensation.  No known vomiting.  She denies any neck or back pain.  She has some pain in her right hand and wrist but no other complaints of injuries.     Past Medical History:  Diagnosis Date   Arthritis    Hypertension    Parkinson's disease [facility])     Patient Active Problem List   Diagnosis Date Noted   S/P total hip arthroplasty 02/12/2015   Acute blood loss anemia 02/12/2015   CKD (chronic kidney disease) stage 3, GFR 30-59 ml/min (HCC) 02/12/2015   Hypertension    Arthritis    Parkinson's disease (HCC)     Past Surgical History:  Procedure Laterality Date   ABDOMINAL HYSTERECTOMY     HIP ARTHROPLASTY Left 01/2015   REPLACEMENT TOTAL KNEE Bilateral      OB History   No obstetric history on file.      Home Medications    Prior to Admission medications   Medication Sig Start Date End Date Taking? Authorizing Provider  alendronate (FOSAMAX) 70 MG tablet Take 70 mg by mouth once a week. Take with a full glass of water on an empty stomach.   Yes [provider]  aspirin 81 MG tablet Take 81 mg by mouth daily.   Yes [provider]  betamethasone dipropionate (DIPROLENE) 0.05 % cream Apply topically 2 (two) times daily as needed.   Yes [provider]  Calcium Carb-Cholecalciferol 600-200 MG-UNIT TABS Take 2 tablets by mouth daily.   Yes [provider]  ferrous sulfate 325 (65 FE) MG tablet Take 325 mg by mouth daily with breakfast.   Yes [provider]  folic acid (FOLVITE) 1 MG tablet Take 1 mg by mouth daily.   Yes [provider]  gabapentin (NEURONTIN) 600 MG tablet Take 600 mg by mouth 2 (two) times daily. 1 po q am and 2 po qhs   Yes [provider]  hydrochlorothiazide (HYDRODIURIL) 25 MG tablet Take 25 mg by mouth daily.   Yes [provider]  lisinopril (PRINIVIL,ZESTRIL) 20 MG tablet Take 20 mg by mouth daily.   Yes [provider]  methotrexate (RHEUMATREX) 15 MG tablet Take 15 mg by mouth once a week. Caution: Chemotherapy. Protect from light.   Yes [provider]  metroNIDAZOLE (METROCREAM) 0.75 % cream Apply topically daily.   Yes [provider]  neomycin-bacitracin-polymyxin (NEOSPORIN) ointment Apply 1 application every 12 (twelve) hours topically. apply to eye 07/26/17  Yes Nanavati, Ankit, MD  predniSONE (DELTASONE) 5 MG tablet Take 5 mg by mouth daily with breakfast.   Yes [provider]  rotigotine (NEUPRO) 4 MG/24HR Place 1 patch onto the skin  daily.   Yes [provider]  sertraline (ZOLOFT) 100 MG tablet Take 100 mg by mouth daily.   Yes [provider]  traMADol (ULTRAM) 50 MG tablet Take 50 mg by mouth 4 (four) times daily. #20, NR   Yes [provider]    Family History Family History  Problem Relation Age of Onset   Thrombosis Mother    Cancer Father        breast   Heart disease Neg Hx    Diabetes Neg Hx     Social History Social History   Tobacco Use   Smoking status: Never Smoker   Smokeless tobacco: Never Used  Substance Use Topics   Alcohol use: No   Drug use: No     Allergies   Codeine; Lipitor [atorvastatin]; Penicillins; and Sulfa antibiotics   Review of  Systems Review of Systems  Constitutional: Negative for chills, diaphoresis, fatigue and fever.  HENT: Negative for congestion, rhinorrhea and sneezing.   Eyes: Negative.   Respiratory: Negative for cough, chest tightness and shortness of breath.   Cardiovascular: Negative for chest pain and leg swelling.  Gastrointestinal: Positive for nausea. Negative for abdominal pain, blood in stool, diarrhea and vomiting.  Genitourinary: Negative for difficulty urinating, flank pain, frequency and hematuria.  Musculoskeletal: Positive for arthralgias. Negative for back pain.  Skin: Positive for wound. Negative for rash.  Neurological: Positive for headaches. Negative for dizziness, speech difficulty, weakness and numbness.     Physical Exam Updated Vital Signs BP (!) 148/70 (BP Location: Right Arm)    Pulse 71    Temp 98.1 F (36.7 C) (Oral)    Resp 16    Ht 5\' 3"  (1.6 m)    Wt 61.7 kg    SpO2 97%    BMI 24.09 kg/m   Physical Exam Constitutional:      Appearance: She is well-developed.  HENT:     Head: Normocephalic.     Comments: Small hematoma to right temporal area Eyes:     Pupils: Pupils are equal, round, and reactive to light.  Neck:     Musculoskeletal: Normal range of motion and neck supple.     Comments: No pain to the cervical thoracic or lumbosacral spine Cardiovascular:     Rate and Rhythm: Normal rate and regular rhythm.     Heart sounds: Normal heart sounds.  Pulmonary:     Effort: Pulmonary effort is normal. No respiratory distress.     Breath sounds: Normal breath sounds. No wheezing or rales.  Chest:     Chest wall: No tenderness.  Abdominal:     General: Bowel sounds are normal.     Palpations: Abdomen is soft.     Tenderness: There is no abdominal tenderness. There is no guarding or rebound.  Musculoskeletal: Normal range of motion.     Comments: Patient has some abrasions to her right hand and wrist as well as elbow.  There is some tenderness over the ulnar  surface of her right wrist and over the fifth metacarpal.  There is no bony tenderness to the elbow on palpation or range of motion.  There is no other pain on palpation or range of motion of the extremities.  Lymphadenopathy:     Cervical: No cervical adenopathy.  Skin:    General: Skin is warm and dry.     Findings: No rash.  Neurological:     Mental Status: She is alert and oriented to person, place, and time.  ED Treatments / Results  Labs (all labs ordered are listed, but only abnormal results are displayed) Labs Reviewed - No data to display  EKG None  Radiology Dg Wrist Complete Right  Result Date: 12/16/2018 CLINICAL DATA:  Right wrist pain after fall. EXAM: RIGHT WRIST - COMPLETE 3+ VIEW COMPARISON:  None. FINDINGS: Irregular bone fragment is seen proximal to proximal base of first metacarpal concerning for possible mildly displaced fracture. Trapezium bone is not visualized. Severe degenerative changes seen between the trapezoid and scaphoid. Degenerative changes seen involving the first carpometacarpal joint. Soft tissues are unremarkable. IMPRESSION: Irregular bone fragment seen proximal to first metacarpal concerning for possible mildly displaced fracture. Extensive degenerative changes are noted as described above. Electronically Signed   By: Lupita Raider, M.D.   On: 12/16/2018 14:23   Ct Head Wo Contrast  Result Date: 12/16/2018 CLINICAL DATA:  Posttraumatic headache and dizziness after fall. No loss of consciousness. EXAM: CT HEAD WITHOUT CONTRAST CT CERVICAL SPINE WITHOUT CONTRAST TECHNIQUE: Multidetector CT imaging of the head and cervical spine was performed following the standard protocol without intravenous contrast. Multiplanar CT image reconstructions of the cervical spine were also generated. COMPARISON:  CT scan of June 23, 2018. FINDINGS: CT HEAD FINDINGS Brain: Mild diffuse cortical atrophy is noted. Mild chronic ischemic white matter disease is noted.  No mass effect or midline shift is noted. Ventricular size is within normal limits. There is no evidence of mass lesion, hemorrhage or acute infarction. Vascular: No hyperdense vessel or unexpected calcification. Skull: Normal. Negative for fracture or focal lesion. Sinuses/Orbits: No acute finding. Other: Small right frontal scalp hematoma is noted. CT CERVICAL SPINE FINDINGS Alignment: Minimal grade 1 anterolisthesis of C4-5 is noted secondary to posterior facet joint hypertrophy. Skull base and vertebrae: No acute fracture. No primary bone lesion or focal pathologic process. Soft tissues and spinal canal: No prevertebral fluid or swelling. No visible canal hematoma. Disc levels: Severe degenerative disc disease is noted at C5-6 and C6-7 with anterior posterior osteophyte formation. Upper chest: Negative. Other: Degenerative changes are seen involving posterior facet joints bilaterally. IMPRESSION: Mild diffuse cortical atrophy. Mild chronic ischemic white matter disease. Small right frontal scalp hematoma. No acute intracranial abnormality seen. Severe multilevel degenerative disc disease. No acute abnormality seen in the cervical spine. Electronically Signed   By: Lupita Raider, M.D.   On: 12/16/2018 14:03   Ct Cervical Spine Wo Contrast  Result Date: 12/16/2018 CLINICAL DATA:  Posttraumatic headache and dizziness after fall. No loss of consciousness. EXAM: CT HEAD WITHOUT CONTRAST CT CERVICAL SPINE WITHOUT CONTRAST TECHNIQUE: Multidetector CT imaging of the head and cervical spine was performed following the standard protocol without intravenous contrast. Multiplanar CT image reconstructions of the cervical spine were also generated. COMPARISON:  CT scan of June 23, 2018. FINDINGS: CT HEAD FINDINGS Brain: Mild diffuse cortical atrophy is noted. Mild chronic ischemic white matter disease is noted. No mass effect or midline shift is noted. Ventricular size is within normal limits. There is no evidence of  mass lesion, hemorrhage or acute infarction. Vascular: No hyperdense vessel or unexpected calcification. Skull: Normal. Negative for fracture or focal lesion. Sinuses/Orbits: No acute finding. Other: Small right frontal scalp hematoma is noted. CT CERVICAL SPINE FINDINGS Alignment: Minimal grade 1 anterolisthesis of C4-5 is noted secondary to posterior facet joint hypertrophy. Skull base and vertebrae: No acute fracture. No primary bone lesion or focal pathologic process. Soft tissues and spinal canal: No prevertebral fluid or swelling. No visible canal hematoma.  Disc levels: Severe degenerative disc disease is noted at C5-6 and C6-7 with anterior posterior osteophyte formation. Upper chest: Negative. Other: Degenerative changes are seen involving posterior facet joints bilaterally. IMPRESSION: Mild diffuse cortical atrophy. Mild chronic ischemic white matter disease. Small right frontal scalp hematoma. No acute intracranial abnormality seen. Severe multilevel degenerative disc disease. No acute abnormality seen in the cervical spine. Electronically Signed   By: Lupita Raider, M.D.   On: 12/16/2018 14:03   Dg Hand Complete Right  Result Date: 12/16/2018 CLINICAL DATA:  Right hand pain after fall. EXAM: RIGHT HAND - COMPLETE 3+ VIEW COMPARISON:  Radiographs of Jan 24, 2017. FINDINGS: There is no evidence of fracture or dislocation. Severe narrowing and osteophyte formation is seen involving first carpometacarpal joint as well as the interphalangeal joints most consistent with osteoarthritis. Trapezium is not visualized. Soft tissues are unremarkable. IMPRESSION: No acute abnormality seen in the right hand. Osteoarthritis is seen involving multiple joints. Electronically Signed   By: Lupita Raider, M.D.   On: 12/16/2018 14:18    Procedures Procedures (including critical care time)  Medications Ordered in ED Medications  ondansetron (ZOFRAN-ODT) disintegrating tablet 4 mg (4 mg Oral Given 12/16/18 1441)       Initial Impression / Assessment and Plan / ED Course  I have reviewed the triage vital signs and the nursing notes.  Pertinent labs & imaging results that were available during my care of the patient were reviewed by me and considered in my medical decision making (see chart for details).        Patient is a 73 year old female who presents after mechanical fall.  CT scan shows no evidence of intracranial hemorrhage or skull fracture.  No evidence of spinal injury.  She is neurologically intact.  She had some tenderness to her right hand and wrist following the fall.  X-rays demonstrate a possible chip fracture to her first metacarpal.  Patient does not have any bony tenderness to the radial side of her hand.  Given this, I suspect this is a chronic type finding.  She was discharged home in good condition.  She was encouraged to follow-up with her PCP if her symptoms are not improving or return here as needed for any worsening symptoms.  Her nausea and dizziness improved markedly after dose of Zofran in the ED.  Final Clinical Impressions(s) / ED Diagnoses   Final diagnoses:  Fall, initial encounter  Injury of head, initial encounter  Abrasion    ED Discharge Orders    None       Rolan Bucco, MD 12/16/18 1444

## 2018-12-16 NOTE — ED Triage Notes (Addendum)
Patient tripped and fell in driveway when trying to pick up an earthworm to save it from dying. Patient is not on blood thinners, but reports headache and has hematoma above right eye and abrasions to right wrist and elbow. Hx of dementia.

## 2019-04-01 ENCOUNTER — Encounter (HOSPITAL_BASED_OUTPATIENT_CLINIC_OR_DEPARTMENT_OTHER): Payer: Self-pay | Admitting: *Deleted

## 2019-04-01 ENCOUNTER — Emergency Department (HOSPITAL_BASED_OUTPATIENT_CLINIC_OR_DEPARTMENT_OTHER): Payer: Medicare Other

## 2019-04-01 ENCOUNTER — Emergency Department (HOSPITAL_BASED_OUTPATIENT_CLINIC_OR_DEPARTMENT_OTHER)
Admission: EM | Admit: 2019-04-01 | Discharge: 2019-04-01 | Disposition: A | Discharge status: Home / Self Care | Payer: Medicare Other | Attending: Emergency Medicine | Admitting: Emergency Medicine

## 2019-04-01 ENCOUNTER — Other Ambulatory Visit: Payer: Self-pay

## 2019-04-01 DIAGNOSIS — Y9301 Activity, walking, marching and hiking: Secondary | ICD-10-CM | POA: Insufficient documentation

## 2019-04-01 DIAGNOSIS — Y929 Unspecified place or not applicable: Secondary | ICD-10-CM | POA: Insufficient documentation

## 2019-04-01 DIAGNOSIS — Y999 Unspecified external cause status: Secondary | ICD-10-CM | POA: Diagnosis not present

## 2019-04-01 DIAGNOSIS — Z96642 Presence of left artificial hip joint: Secondary | ICD-10-CM | POA: Insufficient documentation

## 2019-04-01 DIAGNOSIS — Z79899 Other long term (current) drug therapy: Secondary | ICD-10-CM | POA: Diagnosis not present

## 2019-04-01 DIAGNOSIS — I129 Hypertensive chronic kidney disease with stage 1 through stage 4 chronic kidney disease, or unspecified chronic kidney disease: Secondary | ICD-10-CM | POA: Insufficient documentation

## 2019-04-01 DIAGNOSIS — W010XXA Fall on same level from slipping, tripping and stumbling without subsequent striking against object, initial encounter: Secondary | ICD-10-CM | POA: Diagnosis not present

## 2019-04-01 DIAGNOSIS — Z96653 Presence of artificial knee joint, bilateral: Secondary | ICD-10-CM | POA: Diagnosis not present

## 2019-04-01 DIAGNOSIS — N183 Chronic kidney disease, stage 3 (moderate): Secondary | ICD-10-CM | POA: Insufficient documentation

## 2019-04-01 DIAGNOSIS — Z7982 Long term (current) use of aspirin: Secondary | ICD-10-CM | POA: Insufficient documentation

## 2019-04-01 DIAGNOSIS — S299XXA Unspecified injury of thorax, initial encounter: Secondary | ICD-10-CM | POA: Diagnosis present

## 2019-04-01 DIAGNOSIS — S20212A Contusion of left front wall of thorax, initial encounter: Secondary | ICD-10-CM | POA: Diagnosis not present

## 2019-04-01 DIAGNOSIS — G20 Parkinson's disease: Secondary | ICD-10-CM | POA: Insufficient documentation

## 2019-04-01 MED ORDER — LIDOCAINE 5 % EX PTCH: 1.0000 | MEDICATED_PATCH | CUTANEOUS | 0 refills | Status: AC

## 2019-04-01 NOTE — ED Provider Notes (Signed)
[place] EMERGENCY DEPARTMENT Provider Note   CSN: [id number] Arrival date & time: 04/01/19  1753     History   Chief Complaint Chief Complaint  Patient presents with  . Fall    HPI Venetta Zinni is a 73 y.o. female.      Fall This is a new problem. The current episode started 6 to 12 hours ago. The problem has been resolved. Associated symptoms include chest pain (right side of ribs). Pertinent negatives include no abdominal pain, no headaches and no shortness of breath. Nothing aggravates the symptoms. Nothing relieves the symptoms. She has tried nothing for the symptoms. The treatment provided no relief.    Past Medical History:  Diagnosis Date  . Arthritis   . Hypertension   . Parkinson's disease [facility])     Patient Active Problem List   Diagnosis Date Noted  . S/P total hip arthroplasty 02/12/2015  . Acute blood loss anemia 02/12/2015  . CKD (chronic kidney disease) stage 3, GFR 30-59 ml/min (HCC) 02/12/2015  . Hypertension   . Arthritis   . Parkinson's disease [facility])     Past Surgical History:  Procedure Laterality Date  . ABDOMINAL HYSTERECTOMY    . HIP ARTHROPLASTY Left 01/2015  . REPLACEMENT TOTAL KNEE Bilateral      OB History   No obstetric history on file.      Home Medications    Prior to Admission medications   Medication Sig Start Date End Date Taking? Authorizing Provider  alendronate (FOSAMAX) 70 MG tablet Take 70 mg by mouth once a week. Take with a full glass of water on an empty stomach.    [provider]  aspirin 81 MG tablet Take 81 mg by mouth daily.    [provider]  betamethasone dipropionate (DIPROLENE) 0.05 % cream Apply topically 2 (two) times daily as needed.    [provider]  Calcium Carb-Cholecalciferol 600-200 MG-UNIT TABS Take 2 tablets by mouth daily.    [provider]  ferrous sulfate 325 (65 FE) MG tablet Take 325 mg by mouth daily with breakfast.    [provider]  folic acid (FOLVITE) 1 MG tablet Take 1 mg by mouth daily.    [provider]  gabapentin (NEURONTIN) 600 MG tablet Take 600 mg by mouth 2 (two) times daily. 1 po q am and 2 po qhs    [provider]  hydrochlorothiazide (HYDRODIURIL) 25 MG tablet Take 25 mg by mouth daily.    [provider]  lidocaine (LIDODERM) 5 % Place 1 patch onto the skin daily for 15 days. Remove & Discard patch within 12 hours or as directed by MD 04/01/19 04/16/19  Lennice Sites, DO  lisinopril (PRINIVIL,ZESTRIL) 20 MG tablet Take 20 mg by mouth daily.    [provider]  methotrexate (RHEUMATREX) 15 MG tablet Take 15 mg by mouth once a week. Caution: Chemotherapy. Protect from light.    [provider]  metroNIDAZOLE (METROCREAM) 0.75 % cream Apply topically daily.    [provider]  neomycin-bacitracin-polymyxin (NEOSPORIN) ointment Apply 1 application every 12 (twelve) hours topically. apply to eye 07/26/17   Varney Biles, MD  predniSONE (DELTASONE) 5 MG tablet Take 5 mg by mouth daily with breakfast.    [provider]  rotigotine (NEUPRO) 4 MG/24HR Place 1 patch onto the skin daily.    [provider]  sertraline (ZOLOFT) 100 MG tablet Take 100 mg by mouth daily.    [provider]  traMADol (ULTRAM) 50 MG tablet Take 50 mg by mouth 4 (four) times daily. #20, NR    [provider]    Family History Family History  Problem Relation Age of Onset  . Thrombosis Mother   . Cancer Father        breast  . Heart disease Neg Hx   . Diabetes Neg Hx     Social History Social History   Tobacco Use  . Smoking status: Never Smoker  . Smokeless tobacco: Never Used  Substance Use Topics  . Alcohol use: No  . Drug use: No     Allergies   Codeine, Lipitor [atorvastatin], Penicillins, and Sulfa antibiotics   Review of Systems Review of Systems  Constitutional: Negative for chills and fever.  HENT: Negative for ear  pain and sore throat.   Eyes: Negative for pain and visual disturbance.  Respiratory: Negative for cough and shortness of breath.   Cardiovascular: Positive for chest pain (right side of ribs). Negative for palpitations.  Gastrointestinal: Negative for abdominal pain and vomiting.  Genitourinary: Negative for dysuria and hematuria.  Musculoskeletal: Negative for arthralgias and back pain.  Skin: Negative for color change and rash.  Neurological: Negative for seizures, syncope and headaches.  All other systems reviewed and are negative.    Physical Exam Updated Vital Signs BP 126/67 (BP Location: Left Arm)   Pulse 77   Temp 98.2 F (36.8 C) (Oral)   Resp 19   Ht 5\' 3"  (1.6 m)   Wt 59 kg   SpO2 95%   BMI 23.03 kg/m   Physical Exam Vitals signs and nursing note reviewed.  Constitutional:      General: She is not in acute distress.    Appearance: She is well-developed.  HENT:     Head: Normocephalic and atraumatic.     Nose: Nose normal.     Mouth/Throat:     Mouth: Mucous membranes are moist.  Eyes:     Extraocular Movements: Extraocular movements intact.     Conjunctiva/sclera: Conjunctivae normal.     Pupils: Pupils are equal, round, and reactive to light.  Neck:     Musculoskeletal: Normal range of motion and neck supple. No muscular tenderness.  Cardiovascular:     Rate and Rhythm: Normal rate and regular rhythm.     Pulses: Normal pulses.     Heart sounds: Normal heart sounds. No murmur.  Pulmonary:     Effort: Pulmonary effort is normal. No respiratory distress.     Breath sounds: Normal breath sounds.  Abdominal:     Palpations: Abdomen is soft.     Tenderness: There is no abdominal tenderness.  Musculoskeletal: Normal range of motion.        General: Tenderness (TTP to right side of ribs) present.  Skin:    General: Skin is warm and dry.  Neurological:     General: No focal deficit present.     Mental Status: She is alert and oriented to person, place,  and time.     Cranial Nerves: No cranial nerve deficit.     Sensory: No sensory deficit.     Gait: Gait normal.      ED Treatments / Results  Labs (all labs ordered are listed, but only abnormal results are displayed) Labs Reviewed - No data to display  EKG None  Radiology Dg Ribs Unilateral W/chest Right  Result Date: 04/01/2019 CLINICAL DATA:  Pain after fall EXAM: RIGHT RIBS AND CHEST - 3+  VIEW COMPARISON:  None. FINDINGS: No fracture or other bone lesions are seen involving the ribs. There is no evidence of pneumothorax or pleural effusion. Both lungs are clear. Heart size and mediastinal contours are within normal limits. IMPRESSION: Negative. Electronically Signed   By: Gerome Samavid  Williams III M.D   On: 04/01/2019 18:41    Procedures Procedures (including critical care time)  Medications Ordered in ED Medications - No data to display   Initial Impression / Assessment and Plan / ED Course  I have reviewed the triage vital signs and the nursing notes.  Pertinent labs & imaging results that were available during my care of the patient were reviewed by me and considered in my medical decision making (see chart for details).        Mable FillJan Boyan is a 73 year old who presents to the ED with right-sided rib pain after fall.  Patient with history of Parkinson's and difficulty with ambulation.  Was not walking with her walker when she fell and hit the right side of her ribs.  Did not hit her head, no loss of consciousness.  Patient is on blood thinners.  Patient neurologically intact.  Tenderness over the right side of the ribs but x-ray shows no signs of fracture or pneumothorax.  Will prescribe lidocaine patch.  Education about safe ambulation at home was given.  Recommend that they follow-up with primary care doctor.  Patient may need placement at this time as she appears to be failing from a physical standpoint.  Patient had recent right arm fracture as well that was in a cast.   Appeared well intact.  Understand return precautions.  This chart was dictated using voice recognition software.  Despite best efforts to proofread,  errors can occur which can change the documentation meaning.    Final Clinical Impressions(s) / ED Diagnoses   Final diagnoses:  Contusion of rib on left side, initial encounter    ED Discharge Orders         Ordered    lidocaine (LIDODERM) 5 %  Every 24 hours     04/01/19 1952           Virgina NorfolkCuratolo, Miela Desjardin, DO 04/01/19 1955

## 2019-04-01 NOTE — ED Notes (Signed)
ED Provider at bedside. 

## 2019-04-01 NOTE — ED Triage Notes (Signed)
Slip and fall from standing today on tile floor.  Point tenderness on right side under right arm. No bruising or obvious deformity.

## 2019-07-04 ENCOUNTER — Emergency Department (HOSPITAL_BASED_OUTPATIENT_CLINIC_OR_DEPARTMENT_OTHER)
Admission: EM | Admit: 2019-07-04 | Discharge: 2019-07-04 | Disposition: A | Discharge status: Home / Self Care | Payer: Medicare Other | Attending: Emergency Medicine | Admitting: Emergency Medicine

## 2019-07-04 ENCOUNTER — Encounter (HOSPITAL_BASED_OUTPATIENT_CLINIC_OR_DEPARTMENT_OTHER): Payer: Self-pay

## 2019-07-04 ENCOUNTER — Emergency Department (HOSPITAL_BASED_OUTPATIENT_CLINIC_OR_DEPARTMENT_OTHER): Payer: Medicare Other

## 2019-07-04 ENCOUNTER — Other Ambulatory Visit: Payer: Self-pay

## 2019-07-04 DIAGNOSIS — Z7982 Long term (current) use of aspirin: Secondary | ICD-10-CM | POA: Diagnosis not present

## 2019-07-04 DIAGNOSIS — S80212A Abrasion, left knee, initial encounter: Secondary | ICD-10-CM | POA: Insufficient documentation

## 2019-07-04 DIAGNOSIS — S20212A Contusion of left front wall of thorax, initial encounter: Secondary | ICD-10-CM | POA: Diagnosis not present

## 2019-07-04 DIAGNOSIS — I129 Hypertensive chronic kidney disease with stage 1 through stage 4 chronic kidney disease, or unspecified chronic kidney disease: Secondary | ICD-10-CM | POA: Insufficient documentation

## 2019-07-04 DIAGNOSIS — M25512 Pain in left shoulder: Secondary | ICD-10-CM | POA: Insufficient documentation

## 2019-07-04 DIAGNOSIS — S29001A Unspecified injury of muscle and tendon of front wall of thorax, initial encounter: Secondary | ICD-10-CM | POA: Diagnosis present

## 2019-07-04 DIAGNOSIS — Y9301 Activity, walking, marching and hiking: Secondary | ICD-10-CM | POA: Insufficient documentation

## 2019-07-04 DIAGNOSIS — Y929 Unspecified place or not applicable: Secondary | ICD-10-CM | POA: Insufficient documentation

## 2019-07-04 DIAGNOSIS — S50312A Abrasion of left elbow, initial encounter: Secondary | ICD-10-CM | POA: Diagnosis not present

## 2019-07-04 DIAGNOSIS — N183 Chronic kidney disease, stage 3 unspecified: Secondary | ICD-10-CM | POA: Diagnosis not present

## 2019-07-04 DIAGNOSIS — G20 Parkinson's disease: Secondary | ICD-10-CM | POA: Insufficient documentation

## 2019-07-04 DIAGNOSIS — Z96653 Presence of artificial knee joint, bilateral: Secondary | ICD-10-CM | POA: Diagnosis not present

## 2019-07-04 DIAGNOSIS — T07XXXA Unspecified multiple injuries, initial encounter: Secondary | ICD-10-CM

## 2019-07-04 DIAGNOSIS — W101XXA Fall (on)(from) sidewalk curb, initial encounter: Secondary | ICD-10-CM | POA: Diagnosis not present

## 2019-07-04 DIAGNOSIS — Z79899 Other long term (current) drug therapy: Secondary | ICD-10-CM | POA: Diagnosis not present

## 2019-07-04 DIAGNOSIS — Y999 Unspecified external cause status: Secondary | ICD-10-CM | POA: Diagnosis not present

## 2019-07-04 MED ORDER — ACETAMINOPHEN 325 MG PO TABS
650.0000 mg | ORAL_TABLET | Freq: Once | ORAL | Status: AC
  Administered 2019-07-04: 14:00:00 650 mg via ORAL
  Filled 2019-07-04: qty 2

## 2019-07-04 MED ORDER — LIDOCAINE 5 % EX PTCH: 1.0000 | MEDICATED_PATCH | CUTANEOUS | 0 refills | Status: AC

## 2019-07-04 NOTE — ED Provider Notes (Signed)
[place] EMERGENCY DEPARTMENT Provider Note   CSN: [id number] Arrival date & time: 07/04/19  1157     History   Chief Complaint Chief Complaint  Patient presents with  . Fall    HPI Rhonda Santana is a 73 y.o. female.     Patient with history of Parkinson's disease, osteoporosis -- presents the emergency department with body pain after a fall yesterday.  Patient states that she missed a curb while stepping up and fell onto her left side.  She sustained abrasions to her left elbow and left knee.  She complains of pain in her left lateral ribs as well as her left shoulder.  She did not hit her head or lose consciousness.  She denies any neck pain in the current time.  No confusion or vomiting.  No treatments prior to arrival.     Past Medical History:  Diagnosis Date  . Arthritis   . Hypertension   . Parkinson's disease [facility])     Patient Active Problem List   Diagnosis Date Noted  . S/P total hip arthroplasty 02/12/2015  . Acute blood loss anemia 02/12/2015  . CKD (chronic kidney disease) stage 3, GFR 30-59 ml/min 02/12/2015  . Hypertension   . Arthritis   . Parkinson's disease [facility])     Past Surgical History:  Procedure Laterality Date  . ABDOMINAL HYSTERECTOMY    . HIP ARTHROPLASTY Left 01/2015  . REPLACEMENT TOTAL KNEE Bilateral      OB History   No obstetric history on file.      Home Medications    Prior to Admission medications   Medication Sig Start Date End Date Taking? Authorizing Provider  alendronate (FOSAMAX) 70 MG tablet Take 70 mg by mouth once a week. Take with a full glass of water on an empty stomach.    [provider]  aspirin 81 MG tablet Take 81 mg by mouth daily.    [provider]  betamethasone dipropionate (DIPROLENE) 0.05 % cream Apply topically 2 (two) times daily as needed.    [provider]  Calcium Carb-Cholecalciferol 600-200 MG-UNIT TABS Take 2 tablets by mouth daily.    [provider]  ferrous sulfate 325 (65 FE) MG tablet Take 325 mg by mouth daily with breakfast.    [provider]  folic acid (FOLVITE) 1 MG tablet Take 1 mg by mouth daily.    [provider]  gabapentin (NEURONTIN) 600 MG tablet Take 600 mg by mouth 2 (two) times daily. 1 po q am and 2 po qhs    [provider]  hydrochlorothiazide (HYDRODIURIL) 25 MG tablet Take 25 mg by mouth daily.    [provider]  lisinopril (PRINIVIL,ZESTRIL) 20 MG tablet Take 20 mg by mouth daily.    [provider]  methotrexate (RHEUMATREX) 15 MG tablet Take 15 mg by mouth once a week. Caution: Chemotherapy. Protect from light.    [provider]  metroNIDAZOLE (METROCREAM) 0.75 % cream Apply topically daily.    [provider]  neomycin-bacitracin-polymyxin (NEOSPORIN) ointment Apply 1 application every 12 (twelve) hours topically. apply to eye 07/26/17   Varney Biles, MD  predniSONE (DELTASONE) 5 MG tablet Take 5 mg by mouth daily with breakfast.    [provider]  rotigotine (NEUPRO) 4 MG/24HR Place 1 patch onto the skin daily.    [provider]  sertraline (ZOLOFT) 100 MG tablet Take 100 mg by mouth daily.    [provider]  traMADol (ULTRAM) 50 MG tablet Take 50 mg by mouth 4 (four) times daily. #20, NR    [provider]    Family History Family History  Problem Relation Age of Onset  . Thrombosis Mother   . Cancer Father        breast  . Heart disease Neg Hx   . Diabetes Neg Hx     Social History Social History   Tobacco Use  . Smoking status: Never Smoker  . Smokeless tobacco: Never Used  Substance Use Topics  . Alcohol use: No  . Drug use: No     Allergies   Codeine, Lipitor [atorvastatin], Penicillins, and Sulfa antibiotics   Review of Systems Review of Systems  Constitutional: Negative for activity change and fatigue.  HENT: Negative for tinnitus.   Eyes: Negative for  photophobia, pain and visual disturbance.  Respiratory: Negative for shortness of breath.   Cardiovascular: Positive for chest pain (L ribs).  Gastrointestinal: Negative for nausea and vomiting.  Musculoskeletal: Positive for arthralgias and myalgias. Negative for back pain, gait problem, joint swelling and neck pain.  Skin: Positive for wound (abrasions).  Neurological: Negative for dizziness, weakness, light-headedness, numbness and headaches.  Psychiatric/Behavioral: Negative for confusion and decreased concentration.     Physical Exam Updated Vital Signs BP 137/76 (BP Location: Right Arm)   Pulse 74   Temp 98 F (36.7 C) (Oral)   Resp 20   Ht 5\' 3"  (1.6 m)   Wt 67.1 kg   SpO2 98%   BMI 26.22 kg/m   Physical Exam Vitals signs and nursing note reviewed.  Constitutional:      Appearance: She is well-developed.  HENT:     Head: Normocephalic and atraumatic.  Eyes:     Pupils: Pupils are equal, round, and reactive to light.  Neck:     Musculoskeletal: Normal range of motion and neck supple.  Cardiovascular:     Pulses: Normal pulses. No decreased pulses.  Musculoskeletal:        General: Tenderness present.     Left shoulder: She exhibits decreased range of motion, tenderness and bony tenderness. She exhibits no deformity.     Left elbow: She exhibits normal range of motion, no swelling and no effusion. Tenderness found.     Left wrist: Normal.     Left hip: Normal.     Left knee: She exhibits normal range of motion, no swelling and no effusion. Tenderness found.     Left ankle: Normal.     Cervical back: She exhibits normal range of motion, no tenderness and no bony tenderness.     Left upper arm: Normal.       Arms:       Legs:  Skin:    General: Skin is warm and dry.  Neurological:     Mental Status: She is alert.     Sensory: No sensory deficit.     Comments: Motor, sensation, and vascular distal to the injury is fully intact.       ED Treatments /  Results  Labs (all labs ordered are listed, but only abnormal results are displayed) Labs Reviewed - No data to display  EKG None  Radiology Dg Ribs Unilateral W/chest Left  Result Date: 07/04/2019 CLINICAL DATA:  Pt states she missed a step up to the curb and fell yesterday, c/o pain to left anterior lower rib area, no other complaints EXAM: LEFT RIBS AND CHEST - 3+ VIEW COMPARISON:  04/01/2019 FINDINGS: No convincing  acute rib fracture and no rib lesion. Cardiac silhouette is normal in size. No mediastinal or hilar masses. Nodular area of opacity at the right lung base. This may be due to atelectasis. A true nodule is not excluded. Remainder of the lungs is clear. No convincing pleural effusion and no pneumothorax. IMPRESSION: 1. No no acute rib fracture or rib lesion. 2. Possible nodule at the right lung base. Recommend either follow-up chest radiographs, PA and lateral, or unenhanced chest CT. Electronically Signed   By: Amie Portlandavid  Ormond M.D.   On: 07/04/2019 13:35    Procedures Procedures (including critical care time)  Medications Ordered in ED Medications  acetaminophen (TYLENOL) tablet 650 mg (650 mg Oral Given 07/04/19 1415)     Initial Impression / Assessment and Plan / ED Course  I have reviewed the triage vital signs and the nursing notes.  Pertinent labs & imaging results that were available during my care of the patient were reviewed by me and considered in my medical decision making (see chart for details).        Patient seen and examined. Work-up initiated. Medications ordered. Looks well.   Vital signs reviewed and are as follows: BP 137/76 (BP Location: Right Arm)   Pulse 74   Temp 98 F (36.7 C) (Oral)   Resp 20   Ht 5\' 3"  (1.6 m)   Wt 67.1 kg   SpO2 98%   BMI 26.22 kg/m   X-rays neg. patient and son updated.  Pt discussed with and seen by Dr. Anitra LauthPlunkett. Home with lidoderm patches, tylenol. Discussed good deep breaths. Encouraged with uncontrolled pain,  worsening trouble breathing or shortness of breath.  Final Clinical Impressions(s) / ED Diagnoses   Final diagnoses:  Contusion of rib on left side, initial encounter  Abrasions of multiple sites  Acute pain of left shoulder   Patient with rib contusion and musculoskeletal pain after mechanical fall yesterday.  No concern for head or neck injury.  Imaging of the chest and shoulder here is negative.  Cannot rule out occult rib fracture, however suspect rib contusion.  Patient is breathing well.  Normal vital signs, no hypoxia.  Underlying lung parenchyma appears normal.  Will continue pain control measures and outpatient follow-up as needed.  ED Discharge Orders         Ordered    lidocaine (LIDODERM) 5 %  Every 24 hours     07/04/19 1509           Renne CriglerGeiple, Amanda Steuart, PA-C 07/04/19 1519    Gwyneth SproutPlunkett, Whitney, MD 07/14/19 1517

## 2019-07-04 NOTE — ED Triage Notes (Signed)
Pt states she missed a curb/fell yesterday-c/o pain to left rib area-pt with slow gait with own cane-NAD

## 2019-07-04 NOTE — Discharge Instructions (Signed)
Please read and follow all provided instructions.  Your diagnoses today include:  1. Contusion of rib on left side, initial encounter   2. Abrasions of multiple sites   3. Acute pain of left shoulder     Tests performed today include:  An x-ray of the affected areas - does NOT show any broken bones  Vital signs. See below for your results today.   Medications prescribed:   Lidoderm patches - topical medication patch for pain  Take any prescribed medications only as directed.  Home care instructions:   Follow any educational materials contained in this packet  Follow R.I.C.E. Protocol:  R - rest your injury   I  - use ice on injury without applying directly to skin  C - compress injury with bandage or splint  E - elevate the injury as much as possible  Follow-up instructions: Please follow-up with your primary care provider if you continue to have significant pain in 1 week. In this case you may have a more severe injury that requires further care.   Return instructions:   Please return if your fingers are numb or tingling, appear gray or blue, or you have severe pain (also elevate the arm and loosen splint or wrap if you were given one)  Please return to the Emergency Department if you experience worsening symptoms.   Please return if you have any other emergent concerns.  Additional Information:  Your vital signs today were: BP 130/67 (BP Location: Right Arm)    Pulse 62    Temp 98 F (36.7 C) (Oral)    Resp 16    Ht 5\' 3"  (1.6 m)    Wt 67.1 kg    SpO2 99%    BMI 26.22 kg/m  If your blood pressure (BP) was elevated above 135/85 this visit, please have this repeated by your doctor within one month. --------------

## 2019-10-17 ENCOUNTER — Encounter (HOSPITAL_BASED_OUTPATIENT_CLINIC_OR_DEPARTMENT_OTHER): Payer: Self-pay

## 2019-10-17 ENCOUNTER — Emergency Department (HOSPITAL_BASED_OUTPATIENT_CLINIC_OR_DEPARTMENT_OTHER): Payer: Medicare PPO

## 2019-10-17 ENCOUNTER — Emergency Department (HOSPITAL_BASED_OUTPATIENT_CLINIC_OR_DEPARTMENT_OTHER)
Admission: EM | Admit: 2019-10-17 | Discharge: 2019-10-17 | Disposition: A | Discharge status: Home / Self Care | Payer: Medicare PPO | Attending: Emergency Medicine | Admitting: Emergency Medicine

## 2019-10-17 ENCOUNTER — Other Ambulatory Visit: Payer: Self-pay

## 2019-10-17 DIAGNOSIS — N183 Chronic kidney disease, stage 3 unspecified: Secondary | ICD-10-CM | POA: Insufficient documentation

## 2019-10-17 DIAGNOSIS — Z96652 Presence of left artificial knee joint: Secondary | ICD-10-CM | POA: Diagnosis not present

## 2019-10-17 DIAGNOSIS — Z96642 Presence of left artificial hip joint: Secondary | ICD-10-CM | POA: Diagnosis not present

## 2019-10-17 DIAGNOSIS — Y998 Other external cause status: Secondary | ICD-10-CM | POA: Insufficient documentation

## 2019-10-17 DIAGNOSIS — G20 Parkinson's disease: Secondary | ICD-10-CM | POA: Insufficient documentation

## 2019-10-17 DIAGNOSIS — Z96651 Presence of right artificial knee joint: Secondary | ICD-10-CM | POA: Insufficient documentation

## 2019-10-17 DIAGNOSIS — Y929 Unspecified place or not applicable: Secondary | ICD-10-CM | POA: Diagnosis not present

## 2019-10-17 DIAGNOSIS — S8990XA Unspecified injury of unspecified lower leg, initial encounter: Secondary | ICD-10-CM | POA: Diagnosis present

## 2019-10-17 DIAGNOSIS — M545 Low back pain: Secondary | ICD-10-CM | POA: Insufficient documentation

## 2019-10-17 DIAGNOSIS — W19XXXA Unspecified fall, initial encounter: Secondary | ICD-10-CM | POA: Diagnosis not present

## 2019-10-17 DIAGNOSIS — F0390 Unspecified dementia without behavioral disturbance: Secondary | ICD-10-CM | POA: Diagnosis not present

## 2019-10-17 DIAGNOSIS — Y9389 Activity, other specified: Secondary | ICD-10-CM | POA: Insufficient documentation

## 2019-10-17 DIAGNOSIS — M25552 Pain in left hip: Secondary | ICD-10-CM | POA: Insufficient documentation

## 2019-10-17 DIAGNOSIS — Z7982 Long term (current) use of aspirin: Secondary | ICD-10-CM | POA: Insufficient documentation

## 2019-10-17 DIAGNOSIS — Z79899 Other long term (current) drug therapy: Secondary | ICD-10-CM | POA: Diagnosis not present

## 2019-10-17 DIAGNOSIS — S8000XA Contusion of unspecified knee, initial encounter: Secondary | ICD-10-CM | POA: Insufficient documentation

## 2019-10-17 DIAGNOSIS — I129 Hypertensive chronic kidney disease with stage 1 through stage 4 chronic kidney disease, or unspecified chronic kidney disease: Secondary | ICD-10-CM | POA: Insufficient documentation

## 2019-10-17 HISTORY — DX: Unspecified dementia, unspecified severity, without behavioral disturbance, psychotic disturbance, mood disturbance, and anxiety: F03.90

## 2019-10-17 MED ORDER — HYDROCODONE-ACETAMINOPHEN 5-325 MG PO TABS: 1.0000 | ORAL_TABLET | Freq: Four times a day (QID) | ORAL | 0 refills | Status: AC | PRN

## 2019-10-17 MED ORDER — HYDROCODONE-ACETAMINOPHEN 5-325 MG PO TABS
1.0000 | ORAL_TABLET | Freq: Once | ORAL | Status: AC
  Administered 2019-10-17: 1 via ORAL
  Filled 2019-10-17: qty 1

## 2019-10-17 MED FILL — HYDROCODON-APAP 5-325: 5-325 | 4 days supply | Qty: 15 | Fill #0

## 2019-10-17 NOTE — Discharge Instructions (Signed)
Your x-rays did not show any broken bones at this time.  That does not mean that you are not injured and will have pain.  Follow-up with your doctor for a recheck.  Return here for any worsening in your condition.

## 2019-10-17 NOTE — ED Notes (Signed)
Per son pt with hx dementia-added to medical hx

## 2019-10-17 NOTE — ED Notes (Signed)
Given coke and peanut butter crackers, has not eaten today

## 2019-10-17 NOTE — ED Provider Notes (Signed)
[facility] EMERGENCY DEPARTMENT Provider Note   CSN: [id number] Arrival date & time: 10/17/19  1318     History Chief Complaint  Patient presents with  . Fall    Rhonda Santana is a 74 y.o. female.  HPI Patient presents to the emergency department with bilateral knee pain and lower back and hip pain.  The patient states that certain movements and palpation make the pain worse.  Patient states that she gets dizzy when she stands quite often.  Patient states that she does have to take her time when standing.  Patient denies any headache, blurred vision, nausea, vomiting, weakness, dizziness, chest pain, shortness of breath or syncope.  Past Medical History:  Diagnosis Date  . Arthritis   . Dementia (HCC)   . Hypertension   . Parkinson's disease [facility])     Patient Active Problem List   Diagnosis Date Noted  . S/P total hip arthroplasty 02/12/2015  . Acute blood loss anemia 02/12/2015  . CKD (chronic kidney disease) stage 3, GFR 30-59 ml/min 02/12/2015  . Hypertension   . Arthritis   . Parkinson's disease [facility])     Past Surgical History:  Procedure Laterality Date  . ABDOMINAL HYSTERECTOMY    . HIP ARTHROPLASTY Left 01/2015  . REPLACEMENT TOTAL KNEE Bilateral      OB History   No obstetric history on file.     Family History  Problem Relation Age of Onset  . Thrombosis Mother   . Cancer Father        breast  . Heart disease Neg Hx   . Diabetes Neg Hx     Social History   Tobacco Use  . Smoking status: Never Smoker  . Smokeless tobacco: Never Used  Substance Use Topics  . Alcohol use: No  . Drug use: No    Home Medications Prior to Admission medications   Medication Sig Start Date End Date Taking? Authorizing Provider  alendronate (FOSAMAX) 70 MG tablet Take 70 mg by mouth once a week. Take with a full glass of water on an empty stomach.    [provider]  aspirin 81 MG tablet Take 81 mg by mouth daily.    [provider]   betamethasone dipropionate (DIPROLENE) 0.05 % cream Apply topically 2 (two) times daily as needed.    [provider]  Calcium Carb-Cholecalciferol 600-200 MG-UNIT TABS Take 2 tablets by mouth daily.    [provider]  ferrous sulfate 325 (65 FE) MG tablet Take 325 mg by mouth daily with breakfast.    [provider]  folic acid (FOLVITE) 1 MG tablet Take 1 mg by mouth daily.    [provider]  gabapentin (NEURONTIN) 600 MG tablet Take 600 mg by mouth 2 (two) times daily. 1 po q am and 2 po qhs    [provider]  hydrochlorothiazide (HYDRODIURIL) 25 MG tablet Take 25 mg by mouth daily.    [provider]  lidocaine (LIDODERM) 5 % Place 1 patch onto the skin daily. Remove & Discard patch within 12 hours or as directed by MD 07/04/19   Renne Crigler, PA-C  lisinopril (PRINIVIL,ZESTRIL) 20 MG tablet Take 20 mg by mouth daily.    [provider]  methotrexate (RHEUMATREX) 15 MG tablet Take 15 mg by mouth once a week. Caution: Chemotherapy. Protect from light.    [provider]  metroNIDAZOLE (METROCREAM) 0.75 % cream Apply topically daily.    [provider]  neomycin-bacitracin-polymyxin (NEOSPORIN)  ointment Apply 1 application every 12 (twelve) hours topically. apply to eye 07/26/17   Derwood Kaplan, MD  predniSONE (DELTASONE) 5 MG tablet Take 5 mg by mouth daily with breakfast.    [provider]  rotigotine (NEUPRO) 4 MG/24HR Place 1 patch onto the skin daily.    [provider]  sertraline (ZOLOFT) 100 MG tablet Take 100 mg by mouth daily.    [provider]  traMADol (ULTRAM) 50 MG tablet Take 50 mg by mouth 4 (four) times daily. #20, NR    [provider]    Allergies    Codeine, Lipitor [atorvastatin], Penicillins, and Sulfa antibiotics  Review of Systems   Review of Systems All other systems negative except as documented in the HPI. All pertinent positives and  negatives as reviewed in the HPI. Physical Exam Updated Vital Signs BP 138/69 (BP Location: Left Arm)   Pulse 68   Temp 98.4 F (36.9 C) (Oral)   Resp 20   Ht 5\' 2"  (1.575 m)   Wt 45.4 kg   SpO2 98%   BMI 18.29 kg/m   Physical Exam Vitals and nursing note reviewed.  Constitutional:      General: She is not in acute distress.    Appearance: She is well-developed.  HENT:     Head: Normocephalic and atraumatic.  Eyes:     Pupils: Pupils are equal, round, and reactive to light.  Pulmonary:     Effort: Pulmonary effort is normal.  Musculoskeletal:       Back:     Right knee: Tenderness present.     Left knee: Tenderness present.  Skin:    General: Skin is warm and dry.  Neurological:     Mental Status: She is alert and oriented to person, place, and time.     ED Results / Procedures / Treatments   Labs (all labs ordered are listed, but only abnormal results are displayed) Labs Reviewed - No data to display  EKG None  Radiology DG Lumbar Spine Complete  Result Date: 10/17/2019 CLINICAL DATA:  Fall, low back pain EXAM: LUMBAR SPINE - COMPLETE 4+ VIEW COMPARISON:  None. FINDINGS: Osteopenia. No displaced fracture or dislocation of the lumbar spine. Disc spaces and vertebral body heights are preserved. There is mild to moderate multilevel facet degenerative change. No obvious displaced fracture of the partially imaged pelvis. Nonobstructive pattern of overlying bowel gas. IMPRESSION: 1.  Osteopenia. 2. No displaced fracture or dislocation of the lumbar spine. Disc spaces and vertebral body heights are preserved. Mild to moderate multilevel facet degenerative change. Electronically Signed   By: 10/19/2019 M.D.   On: 10/17/2019 15:19   DG Knee Complete 4 Views Left  Result Date: 10/17/2019 CLINICAL DATA:  Fall, pain EXAM: RIGHT KNEE - COMPLETE 4+ VIEW; LEFT KNEE - COMPLETE 4+ VIEW COMPARISON:  None. FINDINGS: Status post bilateral total knee arthroplasty. No fracture or  dislocation of the bilateral knees. No evidence of perihardware fracture or loosening. No knee joint effusion. Soft tissues are unremarkable. IMPRESSION: Status post bilateral total knee arthroplasty without evidence of fracture or dislocation. No evidence of hardware complication. Electronically Signed   By: 10/19/2019 M.D.   On: 10/17/2019 15:20   DG Knee Complete 4 Views Right  Result Date: 10/17/2019 CLINICAL DATA:  Fall, pain EXAM: RIGHT KNEE - COMPLETE 4+ VIEW; LEFT KNEE - COMPLETE 4+ VIEW COMPARISON:  None. FINDINGS: Status post bilateral total knee arthroplasty. No fracture or dislocation of the bilateral knees.  No evidence of perihardware fracture or loosening. No knee joint effusion. Soft tissues are unremarkable. IMPRESSION: Status post bilateral total knee arthroplasty without evidence of fracture or dislocation. No evidence of hardware complication. Electronically Signed   By: Eddie Candle M.D.   On: 10/17/2019 15:20   DG Hip Unilat W or Wo Pelvis 2-3 Views Left  Result Date: 10/17/2019 CLINICAL DATA:  Fall, pain EXAM: DG HIP (WITH OR WITHOUT PELVIS) 2-3V LEFT COMPARISON:  None. FINDINGS: Status post left hip total arthroplasty. No evidence of perihardware fracture or loosening. Osteopenia. No obvious acute displaced fracture of the included pelvis or proximal right femur. Chronic fracture deformity of the left superior pubic ramus. 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 IMPRESSION: 1. Status post left hip total arthroplasty. No evidence of perihardware fracture or loosening. 2.  Osteopenia. Electronically Signed   By: Eddie Candle M.D.   On: 10/17/2019 15:22    Procedures Procedures (including critical care time)  Medications Ordered in ED Medications  HYDROcodone-acetaminophen (NORCO/VICODIN) 5-325 MG per tablet 1 tablet (has no administration in time range)    ED Course  I have reviewed the triage vital signs and the nursing notes.  Pertinent labs & imaging  results that were available during my care of the patient were reviewed by me and considered in my medical decision making (see chart for details).    MDM Rules/Calculators/A&P                      Plan reviewed the patient's x-rays there is no acute abnormalities that require intervention or admission to the hospital.  Patient will be given pain control for her symptoms.  The patient did fall so she has injury associated with this.  Patient has no neurological deficits noted on examination.  Patient has normal sensation and strength in her lower extremities. Final Clinical Impression(s) / ED Diagnoses Final diagnoses:  Fall    Rx / DC Orders ED Discharge Orders    None       Dalia Heading, PA-C 10/17/19 1552    Fredia Sorrow, MD 10/17/19 1600

## 2019-10-17 NOTE — ED Notes (Signed)
Patient transported to X-ray 

## 2019-10-17 NOTE — ED Notes (Signed)
ED Provider at bedside. 

## 2019-10-17 NOTE — ED Triage Notes (Signed)
Pt states she fell yesterday-pain to both knee, left hip and lower back-to triage in w/c-NAD-son with pt

## 2023-06-21 DEATH — deceased
